# Patient Record
Sex: Female | Born: 1990 | Race: Black or African American | Hispanic: No | Marital: Single | Smoking: Never smoker
Health system: Southern US, Community
[De-identification: ages and names within clinical notes are randomized; demographics above are authoritative.]

---
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
## 2011-01-08 ENCOUNTER — Other Ambulatory Visit (HOSPITAL_COMMUNITY): Payer: Self-pay | Admitting: Pediatrics

## 2011-01-08 DIAGNOSIS — IMO0002 Reserved for concepts with insufficient information to code with codable children: Secondary | ICD-10-CM

## 2011-01-16 ENCOUNTER — Ambulatory Visit (HOSPITAL_COMMUNITY)
Admission: RE | Admit: 2011-01-16 | Discharge: 2011-01-16 | Disposition: A | Discharge status: Home / Self Care | Payer: Medicaid Other | Source: Ambulatory Visit | Attending: Pediatrics | Admitting: Pediatrics

## 2011-01-16 DIAGNOSIS — IMO0002 Reserved for concepts with insufficient information to code with codable children: Secondary | ICD-10-CM

## 2011-01-16 DIAGNOSIS — R51 Headache: Secondary | ICD-10-CM | POA: Insufficient documentation

## 2011-01-16 DIAGNOSIS — R209 Unspecified disturbances of skin sensation: Secondary | ICD-10-CM | POA: Insufficient documentation

## 2011-01-16 DIAGNOSIS — M6281 Muscle weakness (generalized): Secondary | ICD-10-CM | POA: Insufficient documentation

## 2014-10-14 ENCOUNTER — Ambulatory Visit (INDEPENDENT_AMBULATORY_CARE_PROVIDER_SITE_OTHER): Payer: 59 | Admitting: Internal Medicine

## 2014-10-14 ENCOUNTER — Encounter: Payer: Self-pay | Admitting: Internal Medicine

## 2014-10-14 ENCOUNTER — Other Ambulatory Visit (INDEPENDENT_AMBULATORY_CARE_PROVIDER_SITE_OTHER): Payer: 59

## 2014-10-14 VITALS — BP 114/62 | HR 72 | Temp 98.6°F | Resp 16 | Ht 64.0 in | Wt 123.8 lb

## 2014-10-14 DIAGNOSIS — B360 Pityriasis versicolor: Secondary | ICD-10-CM | POA: Insufficient documentation

## 2014-10-14 DIAGNOSIS — Z0000 Encounter for general adult medical examination without abnormal findings: Secondary | ICD-10-CM

## 2014-10-14 LAB — COMPREHENSIVE METABOLIC PANEL
ALK PHOS: 49 U/L (ref 39–117)
ALT: 11 U/L (ref 0–35)
AST: 13 U/L (ref 0–37)
Albumin: 4.4 g/dL (ref 3.5–5.2)
BILIRUBIN TOTAL: 0.7 mg/dL (ref 0.2–1.2)
BUN: 17 mg/dL (ref 6–23)
CO2: 28 mEq/L (ref 19–32)
CREATININE: 0.69 mg/dL (ref 0.40–1.20)
Calcium: 9.4 mg/dL (ref 8.4–10.5)
Chloride: 105 mEq/L (ref 96–112)
GFR: 135.45 mL/min (ref >60.00)
Glucose, Bld: 86 mg/dL (ref 70–99)
Potassium: 3.8 mEq/L (ref 3.5–5.1)
SODIUM: 138 meq/L (ref 135–145)
TOTAL PROTEIN: 7.5 g/dL (ref 6.0–8.3)

## 2014-10-14 LAB — LIPID PANEL
Cholesterol: 156 mg/dL (ref 0–200)
HDL: 61.4 mg/dL (ref >39.00)
LDL CALC: 78 mg/dL (ref 0–99)
NONHDL: 94.6
Total CHOL/HDL Ratio: 3
Triglycerides: 85 mg/dL (ref 0.0–149.0)
VLDL: 17 mg/dL (ref 0.0–40.0)

## 2014-10-14 LAB — CBC
HCT: 35.3 % — ABNORMAL LOW (ref 36.0–46.0)
Hemoglobin: 11.8 g/dL — ABNORMAL LOW (ref 12.0–15.0)
MCHC: 33.5 g/dL (ref 30.0–36.0)
MCV: 80.5 fl (ref 78.0–100.0)
PLATELETS: 199 10*3/uL (ref 150.0–400.0)
RBC: 4.39 Mil/uL (ref 3.87–5.11)
RDW: 17 % — ABNORMAL HIGH (ref 11.5–15.5)
WBC: 5 10*3/uL (ref 4.0–10.5)

## 2014-10-14 MED ORDER — FLUCONAZOLE 150 MG PO TABS: 300.0000 mg | ORAL_TABLET | ORAL | Status: DC

## 2014-10-14 NOTE — Patient Instructions (Addendum)
We will do your blood work today.   The name of some good groups in town for OB/Gyn are [facility] and [facility] [employer] [facility]. They both have websites and you can look at the doctors they have.   If you are doing well you can come back in 1-2 years for a physical. If you have any problems or questions please feel free to call us sooner.   Health Maintenance - 84-23 Years Old SCHOOL PERFORMANCE After high school, you may attend college or technical or vocational school, enroll in the [employer], or enter the workforce. PHYSICAL, SOCIAL, AND EMOTIONAL DEVELOPMENT  One hour of regular physical activity daily is recommended. Continue to participate in sports.  Develop your own interests and consider community service or volunteerism.  Make decisions about college and work plans.  Throughout these years, you should assume responsibility for your own health care. Increasing independence is important for you.  You may be exploring your sexual identity. Understand that you should never be in a situation that makes you feel uncomfortable, and tell your partner if you do not want to engage in sexual activity.  Body image may become important to you. Be mindful that eating disorders can develop at this time. Talk to your parents or other caregivers if you have concerns about body image, weight gain, or losing weight.  You may notice mood disturbances, depression, anxiety, attention problems, or trouble with alcohol. Talk to your health care provider if you have concerns about mental illness.  Set limits for yourself and talk with your parents or other caregivers about independent decision making.  Handle conflict without physical violence.  Avoid loud noises which may impair hearing.  Limit television and computer time to 2 hours each day. Individuals who engage in excessive inactivity are more likely to become overweight. RECOMMENDED IMMUNIZATIONS  Influenza vaccine.  All adults should be  immunized every year.  All adults, including pregnant women and people with hives-only allergy to eggs, can receive the inactivated influenza (IIV) vaccine.  Adults aged 18-49 years can receive the recombinant influenza (RIV) vaccine. The RIV vaccine does not contain any egg protein.  Tetanus, diphtheria, and acellular pertussis (Td, Tdap) vaccine.  Pregnant women should receive 1 dose of Tdap vaccine during each pregnancy. The dose should be obtained regardless of the length of time since the last dose. Immunization is preferred during the 27th to 36th week of gestation.  An adult who has not previously received Tdap or who does not know his or her vaccine status should receive 1 dose of Tdap. This initial dose should be followed by tetanus and diphtheria toxoids (Td) booster doses every 10 years.  Adults with an unknown or incomplete history of completing a 3-dose immunization series with Td-containing vaccines should begin or complete a primary immunization series including a Tdap dose.  Adults should receive a Td booster every 10 years.  Varicella vaccine.  An adult without evidence of immunity to varicella should receive 2 doses or a second dose if he or she has previously received 1 dose.  Pregnant females who do not have evidence of immunity should receive the first dose after pregnancy. This first dose should be obtained before leaving the health care facility. The second dose should be obtained 4-8 weeks after the first dose.  Human papillomavirus (HPV) vaccine.  Females aged 13-26 years who have not received the vaccine previously should obtain the 3-dose series.  The vaccine is not recommended for pregnant females. However, pregnancy testing  is not needed before receiving a dose. If a female is found to be pregnant after receiving a dose, no treatment is needed. In that case, the remaining doses should be delayed until after the pregnancy.  Males aged 29-21 years who have not  received the vaccine previously should receive the 3-dose series. Males aged 22-26 years may be immunized.  Immunization is recommended through the age of 68 years for any female who has sex with males and did not get any or all doses earlier.  Immunization is recommended for any person with an immunocompromised condition through the age of 68 years if he or she did not get any or all doses earlier.  During the 3-dose series, the second dose should be obtained 4-8 weeks after the first dose. The third dose should be obtained 24 weeks after the first dose and 16 weeks after the second dose.  Measles, mumps, and rubella (MMR) vaccine.  Adults born in [birth year] or later should have 1 or more doses of MMR vaccine unless there is a contraindication to the vaccine or there is laboratory evidence of immunity to each of the three diseases.  A routine second dose of MMR vaccine should be obtained at least 28 days after the first dose for students attending postsecondary schools, health care workers, and international travelers.  For females of childbearing age, rubella immunity should be determined. If there is no evidence of immunity, females who are not pregnant should be vaccinated. If there is no evidence of immunity, females who are pregnant should delay immunization until after pregnancy.  Pneumococcal 13-valent conjugate (PCV13) vaccine.  When indicated, a person who is uncertain of his or her immunization history and has no record of immunization should receive the PCV13 vaccine.  An adult aged 21 years or older who has certain medical conditions and has not been previously immunized should receive 1 dose of PCV13 vaccine. This PCV13 should be followed with a dose of pneumococcal polysaccharide (PPSV23) vaccine. The PPSV23 vaccine dose should be obtained at least 8 weeks after the dose of PCV13 vaccine.  An adult aged 12 years or older who has certain medical conditions and previously received 1 or  more doses of PPSV23 vaccine should receive 1 dose of PCV13. The PCV13 vaccine dose should be obtained 1 or more years after the last PPSV23 vaccine dose.  Pneumococcal polysaccharide (PPSV23) vaccine.  When PCV13 is also indicated, PCV13 should be obtained first.  An adult younger than age 65 years who has certain medical conditions should be immunized.  Any person who resides in a long-term care facility should be immunized.  An adult smoker should be immunized.  People with an immunocompromised condition and certain other conditions should receive both PCV13 and PPSV23 vaccines.  People with human immunodeficiency virus (HIV) infection should be immunized as soon as possible after diagnosis.  Immunization during chemotherapy or radiation therapy should be avoided.  Routine use of PPSV23 vaccine is not recommended for American Indians, [place] Natives, or people younger than 65 years unless there are medical conditions that require PPSV23 vaccine.  When indicated, people who have unknown immunization and have no record of immunization should receive PPSV23 vaccine.  One-time revaccination 5 years after the first dose of PPSV23 is recommended for people aged 19-64 years who have chronic kidney failure, nephrotic syndrome, asplenia, or immunocompromised conditions.  Meningococcal vaccine.  Adults with asplenia or persistent complement component deficiencies should receive 2 doses of quadrivalent meningococcal conjugate (MenACWY-D) vaccine. The doses  should be obtained at least 2 months apart.  Microbiologists working with certain meningococcal bacteria, [place] recruits, people at risk during an outbreak, and people who travel to or live in countries with a high rate of meningitis should be immunized.  A first-year college student up through age 15 years who is living in a residence hall should receive a dose if he or she did not receive a dose on or after his or her 16th  birthday.  Adults who have certain high-risk conditions should receive one or more doses of vaccine.  Hepatitis A vaccine.  Adults who wish to be protected from this disease, have certain high-risk conditions, work with hepatitis A-infected animals, work in hepatitis A research labs, or travel to or work in countries with a high rate of hepatitis A should be immunized.  Adults who were previously unvaccinated and who anticipate close contact with an international adoptee during the first 60 days after arrival in the [place] from a country with a high rate of hepatitis A should be immunized.  Hepatitis B vaccine.  Adults who wish to be protected from this disease, have certain high-risk conditions, may be exposed to blood or other infectious body fluids, are household contacts or sex partners of hepatitis B positive people, are clients or workers in certain care facilities, or travel to or work in countries with a high rate of hepatitis B should be immunized.  Haemophilus influenzae type b (Hib) vaccine.  A previously unvaccinated person with asplenia or sickle cell disease or having a scheduled splenectomy should receive 1 dose of Hib vaccine.  Regardless of previous immunization, a recipient of a hematopoietic stem cell transplant should receive a 3-dose series 6-12 months after his or her successful transplant.  Hib vaccine is not recommended for adults with HIV infection. TESTING  Annual screening for vision and hearing problems is recommended. Vision should be screened at least once between 77-65 years of age.  You may be screened for anemia or tuberculosis.  You should have a blood test to check for high cholesterol.  You should be screened for alcohol and drug use.  If you are sexually active, you may be screened for sexually transmitted infections (STIs), pregnancy, or HIV. You should be screened for STIs if:  Your sexual activity has changed since the last screening  test, and you are at an increased risk for chlamydia or gonorrhea. Ask your health care provider if you are at risk.  If you are at an increased risk for hepatitis B, you should be screened for this virus. You are considered at high risk for hepatitis B if you:  Were born in a country where hepatitis B occurs often. Talk with your health care provider about which countries are considered high risk.  Have parents who were born in a high-risk country and have not received a shot to protect against hepatitis B (hepatitis B vaccine).  Have HIV or AIDS.  Use needles to inject street drugs.  Live with or have sex with someone who has hepatitis B.  Are a man who has sex with other men (MSM).  Get hemodialysis treatment.  Take certain medicines for conditions like cancer, organ transplantation, or autoimmune conditions. NUTRITION   You should:  Have three servings of low-fat milk and dairy products daily. If you do not drink milk or consume dairy products, you should eat calcium-enriched foods, such as juice, bread, or cereal. Dark, leafy greens or canned fish are alternate sources of calcium.  Drink plenty of water. Fruit juice should be limited to 8-12 oz (240-360 mL) each day. Sugary beverages and sodas should be avoided.  Avoid eating foods high in fat, salt, or sugar, such as chips, candy, and cookies.  Avoid fast foods and limit eating out at restaurants.  Try not to skip meals, especially breakfast. You should eat a variety of vegetables, fruits, and lean meats.  Eat meals together as a family whenever possible. ORAL HEALTH Brush your teeth twice a day and floss at least once a day. You should have two dental exams a year.  SKIN CARE You should wear sunscreen when out in the sun. TALK TO SOMEONE ABOUT:  Precautions against pregnancy, contraception, and sexually transmitted infections.  Taking a prescription medicine daily to prevent HIV infection if you are at risk of being  infected with HIV. This is called preexposure prophylaxis (PrEP). You are at risk if you:  Are a female who has sex with other males (MSM).  Are heterosexual and sexually active with more than one partner.  Take drugs by injection.  Are sexually active with a partner who has HIV.  Whether you are at high risk of being infected with HIV. If you choose to begin PrEP, you should first be tested for HIV. You should then be tested every 3 months for as long as you are taking PrEP.  Drug, tobacco, and alcohol use among your friends or at friends' homes. Smoking tobacco or marijuana and taking drugs have health consequences and may impact your brain development.  Appropriate use of over-the-counter or prescription medicines.  Driving guidelines and riding with friends.  The risks of drinking and driving or boating. Call someone if you have been drinking or using drugs and need a ride. WHAT'S NEXT? Visit your pediatrician or family physician once a year. By young adulthood, you should transition from your pediatrician to a family physician or internal medicine specialist. If you are a female and are sexually active, you may want to begin annual physical exams with a gynecologist. Document Released: 11/01/2006 Document Revised: 08/11/2013 Document Reviewed: 11/21/2006 [facility] Patient Information 2015 [place], [place]. This information is not intended to replace advice given to you by your health care provider. Make sure you discuss any questions you have with your health care provider.

## 2014-10-14 NOTE — Progress Notes (Signed)
Pre visit review using our clinic review tool, if applicable. No additional management support is needed unless otherwise documented below in the visit note. 

## 2014-10-14 NOTE — Assessment & Plan Note (Signed)
Up to date on tetanus shot, declines flu shot. Never had pap smear and she will go to gynecology. Non-smoker, exercises minimally. Talked with her about safe sex, moderation and decreased drinking (she does not drink), seat belts, no drugs (she does not).

## 2014-10-14 NOTE — Assessment & Plan Note (Signed)
Fluconazole 300 mg weekly for 3 weeks.

## 2014-10-14 NOTE — Progress Notes (Signed)
   Subjective:    Patient ID: Anna Soto, female    DOB: 1991/01/28, 24 y.o.   MRN: [id number]  HPI The patient is a 24 YO healthy female who is here for wellness. She has tinea versicolor which was partially treated with cream but did not help much. Denies other medical problems.   [facility], social history, allergies, medication, problem list reviewed and updated.   Review of Systems  Constitutional: Negative for fever, chills, activity change, appetite change, fatigue and unexpected weight change.  HENT: Negative.   Eyes: Negative.   Respiratory: Negative for cough, chest tightness, shortness of breath and wheezing.   Cardiovascular: Negative for chest pain, palpitations and leg swelling.  Gastrointestinal: Negative for abdominal pain, diarrhea, constipation and abdominal distention.  Endocrine: Negative.   Musculoskeletal: Negative.   Skin: Negative.   Neurological: Negative.   Psychiatric/Behavioral: Negative.       Objective:   Physical Exam  Constitutional: She is oriented to person, place, and time. She appears well-developed and well-nourished.  HENT:  Head: Normocephalic and atraumatic.  Eyes: EOM are normal.  Neck: Normal range of motion.  Cardiovascular: Normal rate and regular rhythm.   No murmur heard. Pulmonary/Chest: Effort normal and breath sounds normal. No respiratory distress. She has no wheezes. She has no rales.  Abdominal: Soft. Bowel sounds are normal.  Musculoskeletal: She exhibits no edema.  Neurological: She is alert and oriented to person, place, and time.  Skin: Skin is warm and dry.  Psychiatric: She has a normal mood and affect.   Filed Vitals:   10/14/14 1515  BP: 114/62  Pulse: 72  Temp: 98.6 F (37 C)  TempSrc: Oral  Resp: 16  Height: 5\' 4"  (1.626 m)  Weight: 123 lb 12.8 oz (56.155 kg)  SpO2: 99%      Assessment & Plan:

## 2014-10-19 NOTE — Progress Notes (Signed)
Erroneous encounter due to message timedout.

## 2015-10-24 ENCOUNTER — Ambulatory Visit (INDEPENDENT_AMBULATORY_CARE_PROVIDER_SITE_OTHER): Payer: BLUE CROSS/BLUE SHIELD | Admitting: Internal Medicine

## 2015-10-24 ENCOUNTER — Encounter: Payer: Self-pay | Admitting: Internal Medicine

## 2015-10-24 VITALS — BP 110/64 | HR 76 | Temp 98.4°F | Resp 16 | Ht 64.0 in | Wt 123.4 lb

## 2015-10-24 DIAGNOSIS — G4452 New daily persistent headache (NDPH): Secondary | ICD-10-CM | POA: Diagnosis not present

## 2015-10-24 MED ORDER — ATENOLOL 50 MG PO TABS
50.0000 mg | ORAL_TABLET | Freq: Every day | ORAL | Status: DC
Start: 2015-10-24 — End: 2016-03-31

## 2015-10-24 MED ORDER — SUMATRIPTAN SUCCINATE 25 MG PO TABS: 25.0000 mg | ORAL_TABLET | ORAL | Status: DC | PRN

## 2015-10-24 NOTE — Progress Notes (Signed)
Pre visit review using our clinic review tool, if applicable. No additional management support is needed unless otherwise documented below in the visit note. 

## 2015-10-24 NOTE — Patient Instructions (Addendum)
We have sent in a medicine called atenolol that should help to decrease headaches. It may take 1-2 weeks to help fully.   We have also sent in a medicine to abort headaches called sumatriptan that you can take for headaches.   We would like to use try to use ibuprofen sparingly if you can to help decrease the risk of getting rebound headaches from the medicine itself.   The amount of sleep you are getting could be causing these headaches.  Migraine Headache A migraine headache is an intense, throbbing pain on one or both sides of your head. A migraine can last for 30 minutes to several hours. CAUSES  The exact cause of a migraine headache is not always known. However, a migraine may be caused when nerves in the brain become irritated and release chemicals that cause inflammation. This causes pain. Certain things may also trigger migraines, such as:  Alcohol.  Smoking.  Stress.  Menstruation.  Aged cheeses.  Foods or drinks that contain nitrates, glutamate, aspartame, or tyramine.  Lack of sleep.  Chocolate.  Caffeine.  Hunger.  Physical exertion.  Fatigue.  Medicines used to treat chest pain (nitroglycerine), birth control pills, estrogen, and some blood pressure medicines. SIGNS AND SYMPTOMS  Pain on one or both sides of your head.  Pulsating or throbbing pain.  Severe pain that prevents daily activities.  Pain that is aggravated by any physical activity.  Nausea, vomiting, or both.  Dizziness.  Pain with exposure to bright lights, loud noises, or activity.  General sensitivity to bright lights, loud noises, or smells. Before you get a migraine, you may get warning signs that a migraine is coming (aura). An aura may include:  Seeing flashing lights.  Seeing bright spots, halos, or zigzag lines.  Having tunnel vision or blurred vision.  Having feelings of numbness or tingling.  Having trouble talking.  Having muscle weakness. DIAGNOSIS  A migraine  headache is often diagnosed based on:  Symptoms.  Physical exam.  A CT scan or MRI of your head. These imaging tests cannot diagnose migraines, but they can help rule out other causes of headaches. TREATMENT Medicines may be given for pain and nausea. Medicines can also be given to help prevent recurrent migraines.  HOME CARE INSTRUCTIONS  Only take over-the-counter or prescription medicines for pain or discomfort as directed by your health care provider. The use of long-term narcotics is not recommended.  Lie down in a dark, quiet room when you have a migraine.  Keep a journal to find out what may trigger your migraine headaches. For example, write down:  What you eat and drink.  How much sleep you get.  Any change to your diet or medicines.  Limit alcohol consumption.  Quit smoking if you smoke.  Get 7-9 hours of sleep, or as recommended by your health care provider.  Limit stress.  Keep lights dim if bright lights bother you and make your migraines worse. SEEK IMMEDIATE MEDICAL CARE IF:   Your migraine becomes severe.  You have a fever.  You have a stiff neck.  You have vision loss.  You have muscular weakness or loss of muscle control.  You start losing your balance or have trouble walking.  You feel faint or pass out.  You have severe symptoms that are different from your first symptoms. MAKE SURE YOU:   Understand these instructions.  Will watch your condition.  Will get help right away if you are not doing well or get  worse.   This information is not intended to replace advice given to you by your health care provider. Make sure you discuss any questions you have with your health care provider.   Document Released: 08/06/2005 Document Revised: 08/27/2014 Document Reviewed: 04/13/2013 Elsevier Interactive Patient Education 2016 [employer]   Analgesic Rebound Headaches An analgesic rebound headache is a headache that returns after pain medicine  (analgesic) that was taken to treat the initial headache wears off. People who suffer from tension, migraine, or cluster headaches are at risk for developing rebound headaches. Any type of primary headache can return as a rebound headache if you regularly take analgesics more than three times a week. If the cycle of rebound headaches continues, they become chronic daily headaches.  CAUSES Analgesics frequently associated with this problem include common over-the-counter medicines like aspirin, ibuprofen, acetaminophen, sinus relief medicines, and other medicines that contain caffeine. Narcotic pain medicines are also a common cause of rebound headaches.  SIGNS AND SYMPTOMS The symptoms of rebound headaches are the same as the symptoms of your initial headache. Symptoms of specific types of headaches include: Tension headache  Pressure around the head.  Dull, aching head pain.  Pain felt over the front and sides of the head.  Tenderness in the muscles of the head, neck and shoulders. Migraine Headache  Pulsing or throbbing pain on one or both sides of the head.  Severe pain that interferes with daily activities.  Pain that is worsened by physical activity.  Nausea, vomiting, or both.  Pain with exposure to bright light, loud noises, or strong smells.  General sensitivity to bright light, loud noises, or strong smells.  Visual changes.  Numbness of one or both arms. Cluster Headaches  Severe pain that begins in or around one eye or temple.  Redness in the eye on the same side as the pain.  Droopy or swollen eyelid.  One-sided head pain.  Nausea.  Runny nose.  Sweaty, pale facial skin.  Restlessness. DIAGNOSIS  Analgesic rebound headaches are diagnosed by reviewing your medical history. This includes the nature of your initial headaches, as well as the type of pain medicines you have been using to treat your headaches and how often you take  them. TREATMENT Discontinuing frequent use of the analgesic medicine will typically reduce the frequency of the rebound episodes. This may initially worsen your headaches but eventually the pain should become more manageable, less frequent, and less severe.  Seeing a headache specialists may helpful. He or she may be able to help you manage your headaches and to make sure there is not another cause of the headaches. Alternative methods of stress relief such as acupuncture, counseling, biofeedback, and massage may also be helpful. Talk with your health care provider about which alternative treatments might be good for you. HOME CARE INSTRUCTIONS Stopping the regular use of pain medicine can be difficult. Follow your health care provider's instructions carefully. Keep all of your appointments. Avoid triggers that are known to cause your primary headaches. SEEK MEDICAL CARE IF: You continue to experience headaches after following your health care provider's recommended treatments. SEEK IMMEDIATE MEDICAL CARE IF:  You develop new headache pain.  You develop headache pain that is different than what you have experienced in the past.  You develop numbness or tingling in your arms or legs.  You develop changes in your speech or vision. MAKE SURE YOU:  Understand these instructions.  Will watch your child's condition.  Will get help right away  if your child is not doing well or gets worse.   This information is not intended to replace advice given to you by your health care provider. Make sure you discuss any questions you have with your health care provider.   Document Released: 10/27/2003 Document Revised: 08/27/2014 Document Reviewed: 02/19/2013 Elsevier Interactive Patient Education [employer]

## 2015-10-25 DIAGNOSIS — R51 Headache: Secondary | ICD-10-CM

## 2015-10-25 DIAGNOSIS — R519 Headache, unspecified: Secondary | ICD-10-CM | POA: Insufficient documentation

## 2015-10-25 NOTE — Assessment & Plan Note (Signed)
Suspect she has some migraines but also component of analgesic rebound headaches. Advised her to avoid ibuprofen except when needed. Rx for atenolol daily for the new daily headaches to help break the cycle. Also rx for sumatriptan for severe episodes. No repeat MRI today but will watch pattern and if no improvement can consider.

## 2015-10-25 NOTE — Progress Notes (Signed)
   Subjective:    Patient ID: Anna Soto, female    DOB: 02-Nov-1990, 25 y.o.   MRN: [id number]  HPI The patient is a 25 YO female coming in for headaches. She is having morning headaches every day for the last month. Takes ibuprofen 1-2 times daily to help with headache. Also getting some pains that are sharp in her eyes which happen throughout the day. They last a few seconds but getting more common in the last several weeks. Denies change to vision or aura with the eye pains. Has had MRI of her brain in the past (4-5 years ago) for some numbness, weakness, headaches.   Review of Systems  Constitutional: Negative for fever, chills, activity change, appetite change, fatigue and unexpected weight change.  Eyes: Positive for visual disturbance.  Respiratory: Negative for cough, chest tightness, shortness of breath and wheezing.   Cardiovascular: Negative for chest pain, palpitations and leg swelling.  Gastrointestinal: Negative for abdominal pain, diarrhea, constipation and abdominal distention.  Endocrine: Negative.   Musculoskeletal: Negative.   Skin: Negative.   Neurological: Positive for headaches. Negative for dizziness, weakness and numbness.  Psychiatric/Behavioral: Negative.       Objective:   Physical Exam  Constitutional: She is oriented to person, place, and time. She appears well-developed and well-nourished.  HENT:  Head: Normocephalic and atraumatic.  Eyes: EOM are normal.  Neck: Normal range of motion.  Cardiovascular: Normal rate and regular rhythm.   Pulmonary/Chest: Effort normal and breath sounds normal. No respiratory distress. She has no wheezes. She has no rales.  Abdominal: Soft. She exhibits no distension. There is no tenderness.  Musculoskeletal: She exhibits no edema.  Neurological: She is alert and oriented to person, place, and time. Coordination normal.  Skin: Skin is warm and dry.  Psychiatric: She has a normal mood and affect.   Filed Vitals:   10/24/15 1029  BP: 110/64  Pulse: 76  Temp: 98.4 F (36.9 C)  TempSrc: Oral  Resp: 16  Height: 5\' 4"  (1.626 m)  Weight: 123 lb 6.4 oz (55.974 kg)  SpO2: 99%      Assessment & Plan:

## 2016-02-22 ENCOUNTER — Ambulatory Visit (INDEPENDENT_AMBULATORY_CARE_PROVIDER_SITE_OTHER): Payer: BLUE CROSS/BLUE SHIELD | Admitting: Internal Medicine

## 2016-02-22 VITALS — BP 102/80 | HR 81 | Temp 99.2°F | Resp 16 | Wt 122.0 lb

## 2016-02-22 DIAGNOSIS — B360 Pityriasis versicolor: Secondary | ICD-10-CM

## 2016-02-22 MED ORDER — FLUCONAZOLE 150 MG PO TABS: 300.0000 mg | ORAL_TABLET | ORAL | Status: DC

## 2016-02-22 NOTE — Progress Notes (Signed)
   Subjective:    Patient ID: Anna OchsAlicia N Soto, female    DOB: 1991-05-18, 25 y.o.   MRN: [id number]  HPI The patient is coming in for recurrent tinea versicolor. She typically gets an outbreak during the summer months. It is on her shoulder. It is itching some. No pain. Previously treated. No exposure to plants or poison ivy. No new soaps or shampoos or clothing. Her daily headaches from before are all gone and no other new complaints.   Review of Systems  Constitutional: Negative for fever, chills, activity change, appetite change, fatigue and unexpected weight change.  Respiratory: Negative for cough, chest tightness, shortness of breath and wheezing.   Cardiovascular: Negative for chest pain, palpitations and leg swelling.  Gastrointestinal: Negative for abdominal pain, diarrhea, constipation and abdominal distention.  Musculoskeletal: Negative.   Skin: Positive for rash.  Neurological: Negative.       Objective:   Physical Exam  Constitutional: She is oriented to person, place, and time. She appears well-developed and well-nourished.  HENT:  Head: Normocephalic and atraumatic.  Eyes: EOM are normal.  Neck: Normal range of motion.  Cardiovascular: Normal rate and regular rhythm.   Pulmonary/Chest: Effort normal and breath sounds normal. No respiratory distress. She has no wheezes. She has no rales.  Abdominal: Soft. She exhibits no distension. There is no tenderness.  Musculoskeletal: She exhibits no edema.  Neurological: She is alert and oriented to person, place, and time. Coordination normal.  Skin: Skin is warm and dry.  Rash on the shoulder.   Psychiatric: She has a normal mood and affect.   Filed Vitals:   02/22/16 1100  BP: 102/80  Pulse: 81  Temp: 99.2 F (37.3 C)  TempSrc: Oral  Resp: 16  Weight: 122 lb (55.339 kg)  SpO2: 99%      Assessment & Plan:

## 2016-02-22 NOTE — Progress Notes (Signed)
Pre visit review using our clinic review tool, if applicable. No additional management support is needed unless otherwise documented below in the visit note. 

## 2016-02-22 NOTE — Patient Instructions (Signed)
We have sent in the diflucan that you will take. Take 2 pills today, then 2 pills next Wednesday, then 2 pills again the following Wednesday.

## 2016-02-23 ENCOUNTER — Encounter: Payer: Self-pay | Admitting: Internal Medicine

## 2016-02-23 NOTE — Assessment & Plan Note (Signed)
Rx for diflucan. Call back if not improved.

## 2016-03-31 ENCOUNTER — Ambulatory Visit (INDEPENDENT_AMBULATORY_CARE_PROVIDER_SITE_OTHER): Payer: BLUE CROSS/BLUE SHIELD | Admitting: Internal Medicine

## 2016-03-31 ENCOUNTER — Encounter: Payer: Self-pay | Admitting: Internal Medicine

## 2016-03-31 DIAGNOSIS — J029 Acute pharyngitis, unspecified: Secondary | ICD-10-CM

## 2016-03-31 NOTE — Assessment & Plan Note (Signed)
Not clearly infectious Not an allergy sufferer but nasal findings and PND ??atypical infection Will try OTC antihistamines

## 2016-03-31 NOTE — Patient Instructions (Signed)
Please try over the counter fexofenadine  or cetirizine  daily.

## 2016-03-31 NOTE — Progress Notes (Signed)
   Subjective:    Patient ID: Anna Soto, female    DOB: 05/23/1991, 25 y.o.   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   No current facility-administered medications on file prior to visit.     No Known Allergies  No past medical history on file.  Past Surgical History:  Procedure Laterality Date  . SMALL INTESTINE SURGERY      Family History  Problem Relation Age of Onset  . Hypertension Mother   . Hypertension Maternal Aunt   . Hypertension Maternal Uncle   . Hypertension Paternal Aunt   . Hypertension Paternal Uncle   . Diabetes Maternal Grandmother   . Diabetes Maternal Grandfather   . Diabetes Paternal Grandmother   . Diabetes Paternal Grandfather     Social History   Social History  . Marital status: Single    Spouse name: N/A  . Number of children: N/A  . Years of education: N/A   Occupational History  . Not on file.   Social History Main Topics  . Smoking status: Never Smoker  . Smokeless tobacco: Not on file  . Alcohol use No  . Drug use: No  . Sexual activity: Not on file   Other Topics Concern  . Not on file   Social History Narrative  . No narrative on file   Review of Systems  No new rash No vomiting Some nausea a few weeks ago Appetite is off New pillow but only just got it. Dog sleeps in bed with her--not new Cat outside No allergy history     Objective:   Physical Exam  HENT:  Mouth/Throat: No oropharyngeal exudate.  No sinus tenderness Moderate pale nasal  congestion TMs normal Pharynx normal  Neck: Normal range of motion. Neck supple. No thyromegaly present.  Pulmonary/Chest: Effort normal and breath sounds normal. No respiratory distress. She has no wheezes. She has no rales.  Lymphadenopathy:    She has no cervical adenopathy.          Assessment & Plan:

## 2016-04-05 ENCOUNTER — Encounter: Payer: BLUE CROSS/BLUE SHIELD | Admitting: Obstetrics & Gynecology

## 2016-04-24 ENCOUNTER — Encounter: Payer: Self-pay | Admitting: Certified Nurse Midwife

## 2016-04-24 ENCOUNTER — Ambulatory Visit (INDEPENDENT_AMBULATORY_CARE_PROVIDER_SITE_OTHER): Payer: BLUE CROSS/BLUE SHIELD | Admitting: Certified Nurse Midwife

## 2016-04-24 VITALS — BP 115/77 | HR 74 | Ht 63.0 in | Wt 121.0 lb

## 2016-04-24 DIAGNOSIS — Z113 Encounter for screening for infections with a predominantly sexual mode of transmission: Secondary | ICD-10-CM

## 2016-04-24 DIAGNOSIS — Z124 Encounter for screening for malignant neoplasm of cervix: Secondary | ICD-10-CM | POA: Diagnosis not present

## 2016-04-24 DIAGNOSIS — N898 Other specified noninflammatory disorders of vagina: Secondary | ICD-10-CM

## 2016-04-24 DIAGNOSIS — Z01419 Encounter for gynecological examination (general) (routine) without abnormal findings: Secondary | ICD-10-CM

## 2016-04-24 NOTE — Progress Notes (Signed)
Subjective:     Anna Soto is a 25 y.o. female here for a routine exam.  Current complaints: clear to white vaginal discharge x1 week, no odor. She is virginal and no immediate plans for sexual activity, although requests full STD screen. Reports completion of Gardasil series. Personal health questionnaire reviewed: yes. MHx: +migraines-treated with Ibuprofen  Gynecologic History Patient's last menstrual period was 04/02/2016. Contraception: abstinence Last Pap: none.  Last mammogram: n/a  Obstetric History OB History  Gravida Para Term Preterm AB Living  0 0 0 0 0 0  SAB TAB Ectopic Multiple Live Births  0 0 0 0 0       The following portions of the patient's history were reviewed and updated as appropriate: allergies, current medications, past family history, past medical history, past social history, past surgical history and problem list.  Review of Systems Pertinent items are noted in HPI.    Objective:    BP 115/77   Pulse 74   Ht 5\' 3"  (1.6 m)   Wt 121 lb (54.9 kg)   LMP 04/02/2016   BMI 21.43 kg/m   General Appearance:    Alert, cooperative, no distress, appears stated age  Head:    Normocephalic, without obvious abnormality, atraumatic  Eyes:    PERRL, conjunctiva/corneas clear, EOM's intact, fundi    benign, both eyes  Ears:       Nose:   Nares normal  Throat:   Lips, mucosa, and tongue normal; teeth and gums normal  Neck:   Supple, symmetrical, trachea midline, no adenopathy;    thyroid:  no enlargement/tenderness/nodules; no carotid   bruit or JVD  Back:     Symmetric, no curvature, ROM normal, no CVA tenderness  Lungs:     Clear to auscultation bilaterally, respirations unlabored  Chest Wall:       Heart:    Regular rate and rhythm, S1 and S2 normal, no murmur, rub   or gallop  Breast Exam:    No tenderness, masses, or nipple abnormality  Abdomen:     Soft, non-tender, bowel sounds active all four quadrants,    no masses, no organomegaly   Genitalia:    Normal female without lesion or tenderness, scant thin white discharge. Nulliparous. Anteverted. No tenderness or adnexal masses.  Rectal:      Extremities:   Extremities normal, atraumatic, no cyanosis or edema  Pulses:     Skin:   Skin color, texture, turgor normal, no rashes or lesions  Lymph nodes:   Cervical, supraclavicular, and axillary nodes normal  Neurologic:   CNII-XII intact, normal strength, sensation and reflexes    throughout       Assessment:    Healthy female exam.      Plan:    Education reviewed: self breast exams and weight bearing exercise. Contraception: abstinence. Follow up in: 1 year. Pap screen  STD screen

## 2016-04-25 LAB — HIV ANTIBODY (ROUTINE TESTING W REFLEX): HIV 1&2 Ab, 4th Generation: NONREACTIVE

## 2016-04-25 LAB — HEPATITIS B SURFACE ANTIGEN: HEP B S AG: NEGATIVE

## 2016-04-25 LAB — GC/CHLAMYDIA PROBE AMP (~~LOC~~) NOT AT ARMC
CHLAMYDIA, DNA PROBE: NEGATIVE
NEISSERIA GONORRHEA: NEGATIVE

## 2016-04-25 LAB — RPR

## 2016-04-26 LAB — WET PREP, GENITAL
TRICH WET PREP: NONE SEEN
YEAST WET PREP: NONE SEEN

## 2016-04-26 LAB — CYTOLOGY - PAP

## 2016-06-04 ENCOUNTER — Ambulatory Visit (INDEPENDENT_AMBULATORY_CARE_PROVIDER_SITE_OTHER): Payer: BLUE CROSS/BLUE SHIELD | Admitting: Family Medicine

## 2016-06-04 VITALS — BP 122/72 | HR 96 | Temp 98.6°F | Resp 17 | Ht 65.5 in | Wt 117.0 lb

## 2016-06-04 DIAGNOSIS — J029 Acute pharyngitis, unspecified: Secondary | ICD-10-CM

## 2016-06-04 DIAGNOSIS — R0982 Postnasal drip: Secondary | ICD-10-CM

## 2016-06-04 MED ORDER — FLUTICASONE PROPIONATE 50 MCG/ACT NA SUSP: 2.0000 | Freq: Every day | NASAL | 6 refills | Status: DC

## 2016-06-04 NOTE — Progress Notes (Signed)
  Chief Complaint  Patient presents with  . throat pain on left side of unknown origin    HPI Pt reports that she was evaluated in July 2017 for sore throat.  She reports that she continues to have burning pain in her throat.  Sometimes she feels nauseous because of the drainage of burning fluid. She tried claritin without improvement She tried otc chloraseptic sprays with minor relief Reports that with the sore throat it is a nuisance The most worrisome symptoms is that the drainage makes her feel like she is going to vomit Denies fevers or chills  No past medical history on file.  Current Outpatient Prescriptions  Medication Sig Dispense Refill  . ibuprofen (ADVIL,MOTRIN) 200 MG tablet Take 200 mg by mouth every 6 (six) hours as needed.    . fluticasone (FLONASE) 50 MCG/ACT nasal spray Place 2 sprays into both nostrils daily. 16 g 6   No current facility-administered medications for this visit.     Allergies: No Known Allergies  Past Surgical History:  Procedure Laterality Date  . SMALL INTESTINE SURGERY      Social History   Social History  . Marital status: Single    Spouse name: N/A  . Number of children: N/A  . Years of education: N/A   Social History Main Topics  . Smoking status: Never Smoker  . Smokeless tobacco: None  . Alcohol use No  . Drug use: No  . Sexual activity: Not Asked   Other Topics Concern  . None   Social History Narrative  . None    ROS  Objective: Vitals:   06/04/16 1108  BP: 122/72  Pulse: 96  Resp: 17  Temp: 98.6 F (37 C)  TempSrc: Oral  SpO2: 99%  Weight: 117 lb (53.1 kg)  Height: 5' 5.5" (1.664 m)    Physical Exam General: alert, oriented, in NAD Head: normocephalic, atraumatic, no sinus tenderness Eyes: EOM intact, no scleral icterus or conjunctival injection Ears: TM clear bilaterally Throat: no pharyngeal exudate or erythema Lymph: no posterior auricular, submental or cervical lymph adenopathy Heart: normal  rate, normal sinus rhythm, no murmurs Lungs: clear to auscultation bilaterally, no wheezing   Assessment and Plan Anna Soto was seen today for throat pain on left side of unknown origin.  Diagnoses and all orders for this visit:  Postnasal drip -     fluticasone (FLONASE) 50 MCG/ACT nasal spray; Place 2 sprays into both nostrils daily.  Acute pharyngitis, unspecified etiology -     fluticasone (FLONASE) 50 MCG/ACT nasal spray; Place 2 sprays into both nostrils daily.  Advised pt to try flonase for 2 weeks daily and if no improvement will sent to ENT for evaluation for tonsillar crypts   Anna Soto A Creta LevinStallings

## 2016-06-04 NOTE — Patient Instructions (Addendum)
   IF you received an x-ray today, you will receive an invoice from [facility]. Please contact [facility] at [phone] with questions or concerns regarding your invoice.   IF you received labwork today, you will receive an invoice from [facility]. Please contact [facility] at [phone] with questions or concerns regarding your invoice.   Our billing staff will not be able to assist you with questions regarding bills from these companies.  You will be contacted with the lab results as soon as they are available. The fastest way to get your results is to activate your My Chart account. Instructions are located on the last page of this paperwork. If you have not heard from us regarding the results in 2 weeks, please contact this office.    Pharyngitis Pharyngitis is redness, pain, and swelling (inflammation) of your pharynx.  CAUSES  Pharyngitis is usually caused by infection. Most of the time, these infections are from viruses (viral) and are part of a cold. However, sometimes pharyngitis is caused by bacteria (bacterial). Pharyngitis can also be caused by allergies. Viral pharyngitis may be spread from person to person by coughing, sneezing, and personal items or utensils (cups, forks, spoons, toothbrushes). Bacterial pharyngitis may be spread from person to person by more intimate contact, such as kissing.  SIGNS AND SYMPTOMS  Symptoms of pharyngitis include:   Sore throat.   Tiredness (fatigue).   Low-grade fever.   Headache.  Joint pain and muscle aches.  Skin rashes.  Swollen lymph nodes.  Plaque-like film on throat or tonsils (often seen with bacterial pharyngitis). DIAGNOSIS  Your health care provider will ask you questions about your illness and your symptoms. Your medical history, along with a physical exam, is often all that is needed to diagnose pharyngitis. Sometimes, a rapid strep test is done. Other lab tests may  also be done, depending on the suspected cause.  TREATMENT  Viral pharyngitis will usually get better in 3-4 days without the use of medicine. Bacterial pharyngitis is treated with medicines that kill germs (antibiotics).  HOME CARE INSTRUCTIONS   Drink enough water and fluids to keep your urine clear or pale yellow.   Only take over-the-counter or prescription medicines as directed by your health care provider:   If you are prescribed antibiotics, make sure you finish them even if you start to feel better.   Do not take aspirin.   Get lots of rest.   Gargle with 8 oz of salt water ( tsp of salt per 1 qt of water) as often as every 1-2 hours to soothe your throat.   Throat lozenges (if you are not at risk for choking) or sprays may be used to soothe your throat. SEEK MEDICAL CARE IF:   You have large, tender lumps in your neck.  You have a rash.  You cough up green, yellow-brown, or bloody spit. SEEK IMMEDIATE MEDICAL CARE IF:   Your neck becomes stiff.  You drool or are unable to swallow liquids.  You vomit or are unable to keep medicines or liquids down.  You have severe pain that does not go away with the use of recommended medicines.  You have trouble breathing (not caused by a stuffy nose). MAKE SURE YOU:   Understand these instructions.  Will watch your condition.  Will get help right away if you are not doing well or get worse.   This information is not intended to replace advice given to you by your health care   provider. Make sure you discuss any questions you have with your health care provider.   Document Released: 08/06/2005 Document Revised: 05/27/2013 Document Reviewed: 04/13/2013 Elsevier Interactive Patient Education 2016 [employer] Inc[employer]  

## 2016-07-03 ENCOUNTER — Ambulatory Visit (INDEPENDENT_AMBULATORY_CARE_PROVIDER_SITE_OTHER): Payer: BLUE CROSS/BLUE SHIELD

## 2016-07-03 ENCOUNTER — Ambulatory Visit (INDEPENDENT_AMBULATORY_CARE_PROVIDER_SITE_OTHER): Payer: BLUE CROSS/BLUE SHIELD | Admitting: Physician Assistant

## 2016-07-03 VITALS — BP 110/78 | HR 88 | Temp 98.5°F | Resp 16 | Ht 65.5 in | Wt 118.2 lb

## 2016-07-03 DIAGNOSIS — R1032 Left lower quadrant pain: Secondary | ICD-10-CM

## 2016-07-03 LAB — POCT URINALYSIS DIP (MANUAL ENTRY)
BILIRUBIN UA: NEGATIVE
GLUCOSE UA: NEGATIVE
Leukocytes, UA: NEGATIVE
NITRITE UA: NEGATIVE
Protein Ur, POC: NEGATIVE
Spec Grav, UA: 1.025
Urobilinogen, UA: 0.2
pH, UA: 8.5

## 2016-07-03 LAB — POCT CBC
GRANULOCYTE PERCENT: 41.4 % (ref 37–80)
HEMATOCRIT: 33 % — AB (ref 37.7–47.9)
HEMOGLOBIN: 11 g/dL — AB (ref 12.2–16.2)
LYMPH, POC: 3.4 (ref 0.6–3.4)
MCH, POC: 26.3 pg — AB (ref 27–31.2)
MCHC: 33.5 g/dL (ref 31.8–35.4)
MCV: 78.5 fL — AB (ref 80–97)
MID (cbc): 0.4 (ref 0–0.9)
MPV: 8.5 fL (ref 0–99.8)
POC GRANULOCYTE: 2.7 (ref 2–6.9)
POC LYMPH PERCENT: 52.1 %L — AB (ref 10–50)
POC MID %: 6.5 %M (ref 0–12)
Platelet Count, POC: 104 10*3/uL — AB (ref 142–424)
RBC: 4.2 M/uL (ref 4.04–5.48)
RDW, POC: 18.4 %
WBC: 6.5 10*3/uL (ref 4.6–10.2)

## 2016-07-03 LAB — POCT URINE PREGNANCY: Preg Test, Ur: NEGATIVE

## 2016-07-03 LAB — POC MICROSCOPIC URINALYSIS (UMFC): MUCUS RE: ABSENT

## 2016-07-03 NOTE — Progress Notes (Signed)
Anna Ochslicia N Riede  MRN: [id number] DOB: [birth year]  Subjective:  Anna Soto is a 25 y.o. female seen in office today for a chief complaint of LLQ sharp intermittent abdominal pain x 2 days, rates it as a 4/10. Notes it started when she woke up yesterday and has come and gone since then. Has associated intermittent nausea and decreased appetite. Denies vomiting. She has tried ibuprofen with moderate relief.   Her last bowel movement was this morning and notes it was more runny than usual. Her last normal bowel movement was Saturday. She typically has a bowel movement daily but does note she has been having to strain harder since Sunday. Notes the pain was relieved when she went to the bathroom.   She notes she has been eating only fast food the past week because it has been really hectic and has not drank a lot of water . She denies alcohol use. She has not been around anyone with sick contacts. Pt is not currently/has never been sexually active.    Review of Systems  Constitutional: Negative for chills, diaphoresis and fever.  Genitourinary: Positive for decreased urine volume and frequency (has been having to go more often over the past 2 weeks). Negative for dysuria, hematuria, pelvic pain, vaginal bleeding and vaginal discharge.   There are no active problems to display for this patient.   Current Outpatient Prescriptions on File Prior to Visit  Medication Sig Dispense Refill  . fluticasone (FLONASE) 50 MCG/ACT nasal spray Place 2 sprays into both nostrils daily. 16 g 6  . ibuprofen (ADVIL,MOTRIN) 200 MG tablet Take 200 mg by mouth every 6 (six) hours as needed.     No current facility-administered medications on file prior to visit.     No Known Allergies\    Past Surgical History:  Procedure Laterality Date  . SMALL INTESTINE SURGERY  1993    Objective:  BP 110/78 (BP Location: Right Arm, Patient Position: Sitting, Cuff Size: Small)   Pulse 88   Temp 98.5 F (36.9 C)  (Oral)   Resp 16   Ht 5' 5.5" (1.664 m)   Wt 118 lb 3.2 oz (53.6 kg)   LMP 06/23/2016   SpO2 99%   BMI 19.37 kg/m   Physical Exam  Constitutional: She is oriented to person, place, and time and well-developed, well-nourished, and in no distress.  Sitting on exam table texting    HENT:  Head: Normocephalic and atraumatic.  Eyes: Conjunctivae are normal.  Neck: Normal range of motion.  Cardiovascular: Normal rate, regular rhythm and normal heart sounds.   Pulmonary/Chest: Effort normal and breath sounds normal.  Abdominal: Soft. Normal appearance and bowel sounds are normal. There is no tenderness. There is no rigidity, no rebound, no CVA tenderness, no tenderness at McBurney's point and negative Murphy's sign.    Neurological: She is alert and oriented to person, place, and time. Gait normal.  Skin: Skin is warm and dry.  Psychiatric: Affect normal.  Vitals reviewed.   Results for orders placed or performed in visit on 07/03/16 (from the past 24 hour(s))  POCT CBC     Status: Abnormal   Collection Time: 07/03/16  1:04 PM  Result Value Ref Range   WBC 6.5 4.6 - 10.2 K/uL   Lymph, poc 3.4 0.6 - 3.4   POC LYMPH PERCENT 52.1 (A) 10 - 50 %L   MID (cbc) 0.4 0 - 0.9   POC MID % 6.5 0 - 12 %M  POC Granulocyte 2.7 2 - 6.9   Granulocyte percent 41.4 37 - 80 %G   RBC 4.20 4.04 - 5.48 M/uL   Hemoglobin 11.0 (A) 12.2 - 16.2 g/dL   HCT, POC 16.133.0 (A) 09.637.7 - 47.9 %   MCV 78.5 (A) 80 - 97 fL   MCH, POC 26.3 (A) 27 - 31.2 pg   MCHC 33.5 31.8 - 35.4 g/dL   RDW, POC 04.518.4 %   Platelet Count, POC 104 (A) 142 - 424 K/uL   MPV 8.5 0 - 99.8 fL  POCT urine pregnancy     Status: None   Collection Time: 07/03/16  1:14 PM  Result Value Ref Range   Preg Test, Ur Negative Negative  POCT urinalysis dipstick     Status: Abnormal   Collection Time: 07/03/16  1:14 PM  Result Value Ref Range   Color, UA yellow yellow   Clarity, UA clear clear   Glucose, UA negative negative   Bilirubin, UA  negative negative   Ketones, POC UA small (15) (A) negative   Spec Grav, UA 1.025    Blood, UA trace-intact (A) negative   pH, UA 8.5    Protein Ur, POC negative negative   Urobilinogen, UA 0.2    Nitrite, UA Negative Negative   Leukocytes, UA Negative Negative  POCT Microscopic Urinalysis ([facility])     Status: Abnormal   Collection Time: 07/03/16  1:15 PM  Result Value Ref Range   WBC,UR,HPF,POC None None WBC/hpf   RBC,UR,HPF,POC None None RBC/hpf   Bacteria None None, Too numerous to count   Mucus Absent Absent   Epithelial Cells, UR Per Microscopy Few (A) None, Too numerous to count cells/hpf   Dg Abd 2 Views  Result Date: 07/03/2016 CLINICAL DATA:  Left lower quadrant abdominal pain for 2 days EXAM: ABDOMEN - 2 VIEW COMPARISON:  None. FINDINGS: The bowel gas pattern is normal. There is no evidence of free air. No radio-opaque calculi or other significant radiographic abnormality is seen. IMPRESSION: Negative. Electronically Signed   By: Elige KoHetal  Patel   On: 07/03/2016 13:30   Assessment and Plan :  1. LLQ abdominal pain -Pain was not elicited on exam and labs were unremarkable for an etiology. Pt's urine does suggest dehydration. Pt has not had a great diet this week and has not had proper hydration. Declines further imaging. -Overall, pt's clinical exam is reassuring upon presentation. We discussed pursuing further imaging but patient opted for conservative management with BRAT diet and oral hydration at this point considering the primary reason she came in the office today was to reassure her mother.  -Pt instructed that if she develops any sudden onset pain that does not go away, any lower abdominal pain, vomiting, fever, chills, or sweating, seek care immediately. And to contact me if her symptoms do not resolve within 3 days as we can plan to order further imaging at this time. Pt understands and agrees to treatment plan.   Benjiman CoreBrittany Wiseman PA-C  [facility] 07/03/2016 10:44 PM

## 2016-07-03 NOTE — Patient Instructions (Addendum)
I would start eating a bland diet until pain subsides. This includes things like bananas, rice, apples, toast, chicken noodle soup. And drink at least 64 ounces of water.   If you develop any sudden onset pain that does not go away, any lower abdominal pain, vomiting, fever, chills, or sweating, seek care immediately.   Please contact me if your symptoms do not resolved within 3 days as we can plan to order further imaging at this time.    IF you received an x-ray today, you will receive an invoice from [facility]. Please contact [facility] at [phone] with questions or concerns regarding your invoice.   IF you received labwork today, you will receive an invoice from [facility]. Please contact [facility] at [phone] with questions or concerns regarding your invoice.   Our billing staff will not be able to assist you with questions regarding bills from these companies.  You will be contacted with the lab results as soon as they are available. The fastest way to get your results is to activate your My Chart account. Instructions are located on the last page of this paperwork. If you have not heard from us regarding the results in 2 weeks, please contact this office.

## 2016-09-13 ENCOUNTER — Other Ambulatory Visit: Payer: Self-pay | Admitting: Internal Medicine

## 2016-09-24 ENCOUNTER — Ambulatory Visit (INDEPENDENT_AMBULATORY_CARE_PROVIDER_SITE_OTHER): Payer: BLUE CROSS/BLUE SHIELD | Admitting: Physician Assistant

## 2016-09-24 ENCOUNTER — Encounter: Payer: Self-pay | Admitting: Physician Assistant

## 2016-09-24 VITALS — BP 126/78 | HR 92 | Temp 98.9°F | Resp 18 | Ht 65.5 in | Wt 118.0 lb

## 2016-09-24 DIAGNOSIS — G43911 Migraine, unspecified, intractable, with status migrainosus: Secondary | ICD-10-CM

## 2016-09-24 DIAGNOSIS — R0982 Postnasal drip: Secondary | ICD-10-CM

## 2016-09-24 MED ORDER — BUTALBITAL-APAP-CAFFEINE 50-300-40 MG PO CAPS: 1.0000 | ORAL_CAPSULE | ORAL | 0 refills | Status: AC

## 2016-09-24 MED ORDER — TOPIRAMATE 25 MG PO TABS: 25.0000 mg | ORAL_TABLET | Freq: Every day | ORAL | 1 refills | Status: DC

## 2016-09-24 MED ORDER — FLUTICASONE PROPIONATE 50 MCG/ACT NA SUSP: 2.0000 | Freq: Every day | NASAL | 6 refills | Status: DC

## 2016-09-24 NOTE — Progress Notes (Signed)
Anna Soto  MRN: [id number] DOB: 02/10/1991  PCP: Myrlene Broker, MD  Subjective:  Pt is a 26 year old female who presents to clinic for migraine.  Was taking ibuprofen, advised to stop by PCP and started on Sumatriptan 25 mg. She is taking one pill every two hours. Headache doesn't get better until her third dose. She will fall asleep at that time, then headache is present when she wakes up.  Located on left side. "sharp pain" 7/0 pain scale. Pain is constant.  Headache x three weeks. Aura happen about 2-3 times in the 3 weeks.   She has tried Excedrin extra strength. Helps more than sumatriptan. Migraine management by PCP. She thinks she has been on a migraine preventative medication before, she thinks it was Atenolol.   Migraines started when she was in the sixth grade. Started menses a few months prior. Normally gets migraines once or twice a month.  Went to neurologist 2012. MRI - negative.   Also c/o post nasal drip. Needs refill of Flonase.   Review of Systems  Constitutional: Negative for chills, diaphoresis, fatigue and fever.  HENT: Negative for congestion, postnasal drip, rhinorrhea, sinus pressure, sneezing and sore throat.   Respiratory: Negative for cough, chest tightness, shortness of breath and wheezing.   Cardiovascular: Negative for chest pain and palpitations.  Gastrointestinal: Negative for abdominal pain, diarrhea, nausea and vomiting.  Neurological: Positive for headaches. Negative for weakness and light-headedness.    There are no active problems to display for this patient.   Current Outpatient Prescriptions on File Prior to Visit  Medication Sig Dispense Refill  . fluticasone (FLONASE) 50 MCG/ACT nasal spray Place 2 sprays into both nostrils daily. (Patient not taking: Reported on 09/24/2016) 16 g 6  . ibuprofen (ADVIL,MOTRIN) 200 MG tablet Take 200 mg by mouth every 6 (six) hours as needed.     No current facility-administered medications on  file prior to visit.     No Known Allergies   Objective:  BP 126/78 (BP Location: Right Arm, Patient Position: Sitting, Cuff Size: Small)   Pulse 92   Temp 98.9 F (37.2 C) (Oral)   Resp 18   Ht 5' 5.5" (1.664 m)   Wt 118 lb (53.5 kg)   LMP 09/10/2016   SpO2 99%   BMI 19.34 kg/m   Physical Exam  Constitutional: She is oriented to person, place, and time and well-developed, well-nourished, and in no distress. No distress.  Eyes: Conjunctivae and EOM are normal. Pupils are equal, round, and reactive to light.  Cardiovascular: Normal rate, regular rhythm and normal heart sounds.   Neurological: She is alert and oriented to person, place, and time. She has normal sensation. GCS score is 15.  Skin: Skin is warm and dry.  Psychiatric: Mood, memory, affect and judgment normal.  Vitals reviewed.   Assessment and Plan :  1. Intractable migraine with status migrainosus, unspecified migraine type - AMB referral to headache clinic - Butalbital-APAP-Caffeine (FIORICET) 50-300-40 MG CAPS; Take 1-2 tablets by mouth every 4 (four) hours.  Dispense: 84 capsule; Refill: 0 - topiramate (TOPAMAX) 25 MG tablet; Take 1 tablet (25 mg total) by mouth at bedtime. May increase weekly by 25 mg daily, until 100 mg daily in 2 divided doses  Dispense: 90 tablet; Refill: 1 - Start 25mg  qd Topiramate. May increase dose by 25mg  week two. F/u in 2 weeks for medication check. Migraine information printed off for pt.   2. Postnasal drip - fluticasone (FLONASE)  50 MCG/ACT nasal spray; Place 2 sprays into both nostrils daily.  Dispense: 16 g; Refill: 6   Whitney Laurance Heide, PA-C  Primary Care at [facility] 09/24/2016 11:06 AM

## 2016-09-24 NOTE — Patient Instructions (Addendum)
Headache clinic Address: [address], [place], [place] [address] Phone: [phone]   Topiramate The starting topiramate dose is 25 mg/day, May increase by 25mg  next week if needed. Until you reach 100 mg twice daily. Please follow-up with me in 2 weeks for recheck.  Do not abruptly discontinue therapy; taper dosage gradually to prevent rebound effects!! If you desire to stop this medication tell your doctor. You must decrease in weekly intervals by 25 to 50 mg daily    Recurrent Migraine Headache Migraines are a type of headache, and they are usually stronger and more sudden than normal headaches (tension headaches). Migraines are characterized by an intense pulsing, throbbing pain that is usually only present on one side of the head. Sometimes, migraine headaches can cause nausea, vomiting, sensitivity to light and sound, and vision changes. Recurrent migraines keep coming back (recurring). A migraine can last from 4 hours up to 3 days. What are the causes? The exact cause of this condition is not known. However, a migraine may be caused when nerves in the brain become irritated and release chemicals that cause inflammation of blood vessels. This inflammation causes pain. Certain things may also trigger migraines, such as:  A disruption in your regular eating and sleeping schedule.  Smoking.  Stress.  Menstruation.  Certain foods and drinks, such as:  Aged cheese.  Chocolate.  Alcohol.  Caffeine.  Foods or drinks that contain nitrates, glutamate, aspartame, MSG, or tyramine.  Lack of sleep.  Hunger.  Physical exertion.  Fatigue.  High altitude.  Weather changes.  Medicines, such as:  Nitroglycerin, which is used to treat chest pain.  Birth control pills.  Estrogen.  Some blood pressure medicines. What are the signs or symptoms? Symptoms of this condition vary for each person and may include:  Pain that is usually only present on one side of the  head. In some cases, the pain may be on both sides of the head or around the head or neck.  Pulsating or throbbing pain.  Severe pain that prevents daily activities.  Pain that is aggravated by any physical activity.  Nausea, vomiting, or both.  Dizziness.  Pain with exposure to bright lights, loud noises, or activity.  General sensitivity to bright lights, loud noises, or smells. Before you get a migraine, you may get warning signs that a migraine is coming (aura). An aura may include:  Seeing flashing lights.  Seeing bright spots, halos, or zigzag lines.  Having tunnel vision or blurred vision.  Having numbness or a tingling feeling.  Having trouble talking.  Having muscle weakness.  Smelling a certain odor. How is this diagnosed? This condition is often diagnosed based on:  Your symptoms and medical history.  A physical exam. You may also have tests, including:  A CT scan or MRI of your brain. These imaging tests cannot diagnose migraines, but they can help to rule out other causes of headaches.  Blood tests. How is this treated? This condition is treated with:  Medicines. These are used for:  Lessening pain and nausea.  Preventing recurrent migraines.  Lifestyle changes, such as changes to your diet or sleeping patterns.  Behavior therapy, such as relaxation training or biofeedback. Biofeedback is a treatment that involves teaching you to relax and use your brain to lower your heart rate and control your breathing. Follow these instructions at home: Medicines  Take over-the-counter and prescription medicines only as told by your health care provider.  Do not drive or use heavy machinery  while taking prescription pain medicine. Lifestyle  Do not use any products that contain nicotine or tobacco, such as cigarettes and e-cigarettes. If you need help quitting, ask your health care provider.  Limit alcohol intake to no more than 1 drink a day for  nonpregnant women and 2 drinks a day for men. One drink equals 12 oz of beer, 5 oz of wine, or 1 oz of hard liquor.  Get 7-9 hours of sleep each night, or the amount of sleep recommended by your health care provider.  Limit your stress. Talk with your health care provider if you need help with stress management.  Maintain a healthy weight. If you need help losing weight, ask your health care provider.  Exercise regularly. Aim for 150 minutes of moderate-intensity exercise (walking, biking, yoga) or 75 minutes of vigorous exercise (running, circuit training, swimming) each week. General instructions  Keep a journal to find out what triggers your migraine headaches so you can avoid these triggers. For example, write down:  What you eat and drink.  How much sleep you get.  Any change to your diet or medicines.  Lie down in a dark, quiet room when you have a migraine.  Try placing a cool towel over your head when you have a migraine.  Keep lights dim, if bright lights bother you and make your migraines worse.  Keep all follow-up visits as told by your health care provider. This is important. Contact a health care provider if:  Your pain does not improve, even with medicine.  Your migraines continue to return, even with medicine.  You have a fever.  You have weight loss. Get help right away if:  Your migraine becomes severe and medicine does not help.  You have a stiff neck.  You have a loss of vision.  You have muscle weakness or loss of muscle control.  You start losing your balance or have trouble walking.  You feel faint or you pass out.  You develop new, severe symptoms.  You start having abrupt severe headaches that last for a second or less, like a thunderclap. Summary  Migraine headaches are usually stronger and more sudden than normal headaches (tension headaches). Migraines are characterized by an intense pulsing, throbbing pain that is usually only present  on one side of the head.  The exact cause of this condition is not known. However, a migraine may be caused when nerves in the brain become irritated and release chemicals that cause inflammation of blood vessels.  Certain things may trigger migraines, such as changes to diet or sleeping patterns, smoking, certain foods, alcohol, stress, and certain medicines.  Sometimes, migraine headaches can cause nausea, vomiting, sensitivity to light and sound, and vision changes.  Migraines are often diagnosed based on your symptoms, medical history, and a physical exam. This information is not intended to replace advice given to you by your health care provider. Make sure you discuss any questions you have with your health care provider. Document Released: 05/01/2001 Document Revised: 05/18/2016 Document Reviewed: 05/18/2016 Elsevier Interactive Patient Education  2017 [employer].

## 2016-09-26 ENCOUNTER — Ambulatory Visit: Payer: Self-pay | Admitting: Internal Medicine

## 2016-10-10 ENCOUNTER — Ambulatory Visit (INDEPENDENT_AMBULATORY_CARE_PROVIDER_SITE_OTHER): Payer: BLUE CROSS/BLUE SHIELD | Admitting: Physician Assistant

## 2016-10-10 VITALS — BP 100/72 | HR 102 | Temp 98.8°F | Resp 16 | Ht 65.5 in | Wt 113.4 lb

## 2016-10-10 DIAGNOSIS — G43909 Migraine, unspecified, not intractable, without status migrainosus: Secondary | ICD-10-CM | POA: Insufficient documentation

## 2016-10-10 DIAGNOSIS — G43819 Other migraine, intractable, without status migrainosus: Secondary | ICD-10-CM | POA: Diagnosis not present

## 2016-10-10 DIAGNOSIS — Z79899 Other long term (current) drug therapy: Secondary | ICD-10-CM

## 2016-10-10 DIAGNOSIS — R110 Nausea: Secondary | ICD-10-CM

## 2016-10-10 MED ORDER — TOPIRAMATE 25 MG PO TABS: 25.0000 mg | ORAL_TABLET | Freq: Every day | ORAL | 1 refills | Status: DC

## 2016-10-10 NOTE — Patient Instructions (Addendum)
Taper down your dose of Topiramate: Reduce by 40m/day for one week at a time until you are taking 563mtwice a day (10074may total).  Follow-up with me in 4 weeks.  Please know that this medication is harmful to a fetus. If you plan to get pregnant, please let a medical professional know.   Thank you for coming in today. I hope you feel we met your needs.  Feel free to call [facility] if you have any questions or further requests.  Please consider signing up for MyChart if you do not already have it, as this is a great way to communicate with me.  Best,  Whitney McVey, PA-C  IF you received an x-ray today, you will receive an invoice from [facility]. Please contact [facility] at [phone] questions or concerns regarding your invoice.   IF you received labwork today, you will receive an invoice from [facility] at [phone] questions or concerns regarding your invoice.   Our billing staff will not be able to assist you with questions regarding bills from these companies.  You will be contacted with the lab results as soon as they are available. The fastest way to get your results is to activate your My Chart account. Instructions are located on the last page of this paperwork. If you have not heard from us [place] the results in 2 weeks, please contact this office.

## 2016-10-10 NOTE — Progress Notes (Signed)
   Anna Soto  MRN: [id number] DOB: 01-Jan-1991  PCP: Myrlene BrokerElizabeth A Crawford, MD  Subjective:  Pt is a 26 year old female who presents to clinic for f/u migraine. She was seen by me 09/24/2016  Was previously taking Sumatriptan 25 mg for abortive therapy - one pill q 2 hrs. Headache did not improve until 3rd dose.  She gets headaches 2-3 x/week, 7/10 pain. +aura. Affects her daily life/work/relationships. H/o headaches x  Seen by neurology 2012, MRI negative.   At her last OV stared pt on Topiramate 25 mg and advised to titrate up to 100mg  bid. Reports dizziness, tingling in hands and nausea after increased dose. Reports one headache this week and 2 last week. Headaches last 30min -1 hour. Prior to that headaches would last all day.  She is currently taking the 100mg  qd.    Review of Systems  Constitutional: Negative for chills, diaphoresis, fatigue and fever.  Respiratory: Negative for cough, chest tightness, shortness of breath and wheezing.   Gastrointestinal: Positive for nausea. Negative for abdominal pain, diarrhea and vomiting.  Neurological: Positive for dizziness and headaches. Negative for weakness, light-headedness and numbness.    There are no active problems to display for this patient.   Current Outpatient Prescriptions on File Prior to Visit  Medication Sig Dispense Refill  . fluticasone (FLONASE) 50 MCG/ACT nasal spray Place 2 sprays into both nostrils daily. 16 g 6  . ibuprofen (ADVIL,MOTRIN) 200 MG tablet Take 200 mg by mouth every 6 (six) hours as needed.    . topiramate (TOPAMAX) 25 MG tablet Take 1 tablet (25 mg total) by mouth at bedtime. May increase weekly by 25 mg daily, until 100 mg daily in 2 divided doses 90 tablet 1   No current facility-administered medications on file prior to visit.     No Known Allergies   Objective:  BP 100/72   Pulse (!) 102   Temp 98.8 F (37.1 C) (Oral)   Resp 16   Ht 5' 5.5" (1.664 m)   Wt 113 lb 6.4 oz (51.4 kg)   LMP  09/10/2016   SpO2 100%   BMI 18.58 kg/m   Physical Exam  Constitutional: She is oriented to person, place, and time and well-developed, well-nourished, and in no distress. No distress.  Cardiovascular: Normal rate, regular rhythm and normal heart sounds.   Pulmonary/Chest: Effort normal. No respiratory distress.  Neurological: She is alert and oriented to person, place, and time. GCS score is 15.  Skin: Skin is warm and dry.  Psychiatric: Mood, memory, affect and judgment normal.  Vitals reviewed.   Assessment and Plan :  1. Encounter for medication management 2. Other migraine without status migrainosus, intractable 3. Nausea - topiramate (TOPAMAX) 25 MG tablet; Take 1 tablet (25 mg total) by mouth at bedtime. May increase weekly by 25 mg daily, until 100 mg daily in 2 divided doses  Dispense: 90 tablet; Refill: 1 - Pt will titrate down to 25mg  bid due to current medication side effects of nausea and numbness. RTC in 3-4 weeks for f/u.    Marco CollieWhitney Deshane Cotroneo, PA-C  Primary Care at [facility] 10/10/2016 12:04 PM

## 2016-11-01 ENCOUNTER — Ambulatory Visit (INDEPENDENT_AMBULATORY_CARE_PROVIDER_SITE_OTHER): Payer: BLUE CROSS/BLUE SHIELD | Admitting: Physician Assistant

## 2016-11-01 ENCOUNTER — Telehealth: Payer: Self-pay | Admitting: Physician Assistant

## 2016-11-01 VITALS — BP 110/68 | HR 87 | Temp 98.4°F | Ht 62.1 in | Wt 108.0 lb

## 2016-11-01 DIAGNOSIS — J029 Acute pharyngitis, unspecified: Secondary | ICD-10-CM | POA: Diagnosis not present

## 2016-11-01 DIAGNOSIS — R05 Cough: Secondary | ICD-10-CM | POA: Diagnosis not present

## 2016-11-01 DIAGNOSIS — R059 Cough, unspecified: Secondary | ICD-10-CM

## 2016-11-01 MED ORDER — BENZONATATE 100 MG PO CAPS: 100.0000 mg | ORAL_CAPSULE | Freq: Three times a day (TID) | ORAL | 0 refills | Status: DC | PRN

## 2016-11-01 MED ORDER — HYDROCOD POLST-CPM POLST ER 10-8 MG/5ML PO SUER: 5.0000 mL | Freq: Two times a day (BID) | ORAL | 0 refills | Status: DC | PRN

## 2016-11-01 MED ORDER — AZITHROMYCIN 250 MG PO TABS: ORAL_TABLET | ORAL | 0 refills | Status: DC

## 2016-11-01 NOTE — Progress Notes (Signed)
MRN: [id number] DOB: November 19, 1990  Subjective:   Anna Soto is a 26 y.o. female presenting for chief complaint of Sore Throat (x 3 weeks has been worse, better, but lingering); Cough (clear product); and Fatigue .  Reports 3 week history of sinus congestion, rhinorrhea, sore throat and dry cough (no hemoptysis) that is keeping her up throughout the night. Her cough is sometimes productive but mostly dry and hacking.  Has tried nyquil, dayquil, flonase consistently with mild relief. Denies  ear pain, wheezing, shortness of breath and chest tightness, night sweats, chills, fatigue, nausea, vomiting, abdominal pain and diarrhea. Has not had sick contact with any. Has history of seasonal allergies, no history of asthma. Patient has not had flu shot this season. No smoking.  Denies any other aggravating or relieving factors, no other questions or concerns.  Helmut Musterlicia has a current medication list which includes the following prescription(s): fluticasone, ibuprofen, and topiramate. Also has No Known Allergies.  Helmut Musterlicia  has no past medical history on file. Also  has a past surgical history that includes Small intestine surgery (1993).   Objective:   Vitals: BP 110/68 (BP Location: Right Arm, Patient Position: Sitting, Cuff Size: Normal)   Pulse 87   Temp 98.4 F (36.9 C) (Oral)   Ht 5' 2.1" (1.577 m)   Wt 108 lb (49 kg)   LMP 10/24/2016 (Exact Date)   SpO2 97%   BMI 19.69 kg/m   Physical Exam  Constitutional: She is oriented to person, place, and time. She appears well-developed and well-nourished.  HENT:  Head: Normocephalic and atraumatic.  Right Ear: Tympanic membrane, external ear and ear canal normal.  Left Ear: Tympanic membrane, external ear and ear canal normal.  Nose: Mucosal edema ( bilaterally) present. Right sinus exhibits no maxillary sinus tenderness and no frontal sinus tenderness. Left sinus exhibits no maxillary sinus tenderness and no frontal sinus tenderness.    Mouth/Throat: Uvula is midline and mucous membranes are normal. Posterior oropharyngeal erythema present. Tonsils are 1+ on the right. Tonsils are 1+ on the left. No tonsillar exudate.  Eyes: Conjunctivae are normal.  Neck: Normal range of motion.  Cardiovascular: Normal rate, regular rhythm and normal heart sounds.   Pulmonary/Chest: Effort normal and breath sounds normal.  Lymphadenopathy:       Head (right side): No submental, no submandibular, no tonsillar, no preauricular, no posterior auricular and no occipital adenopathy present.       Head (left side): No submental, no submandibular, no tonsillar, no preauricular, no posterior auricular and no occipital adenopathy present.    She has no cervical adenopathy.       Right: No supraclavicular adenopathy present.       Left: No supraclavicular adenopathy present.  Neurological: She is alert and oriented to person, place, and time.  Skin: Skin is warm and dry.  Psychiatric: She has a normal mood and affect.  Vitals reviewed.   No results found for this or any previous visit (from the past 24 hour(s)).  Assessment and Plan :  1. Sore throat 2. Cough History and PE consistent with bronchitis. Due to duration of symptoms will cover for possible underlying atypical bacterial organism as etiology. Will also provide symptomatic management. Instructed to return to clinic if symptoms worsen, do not improve in 7-10 days as she may warrant a CXR at that time, or as needed - benzonatate (TESSALON) 100 MG capsule; Take 1-2 capsules (100-200 mg total) by mouth 3 (three) times daily as needed for  cough.  Dispense: 40 capsule; Refill: 0 - azithromycin (ZITHROMAX) 250 MG tablet; Take 2 tabs PO x 1 dose, then 1 tab PO QD x 4 days  Dispense: 6 tablet; Refill: 0 - chlorpheniramine-HYDROcodone (TUSSIONEX PENNKINETIC ER) 10-8 MG/5ML SUER; Take 5 mLs by mouth every 12 (twelve) hours as needed for cough.  Dispense: 100 mL; Refill: 0  Benjiman Core, PA-C   [facility] 11/01/2016 10:20 AM

## 2016-11-01 NOTE — Telephone Encounter (Signed)
PATIENT STATES SHE SAW BRITTANY WISEMAN TODAY AND SHE DIAGNOSED HER WITH BRONCHITIS. BRITTANY TOLD HER IT WOULD BE BEST TO RETURN BACK TO WORK ON Monday, BUT [place] WANTED TO GO BACK ON Friday. HER SUPERVISOR TOLD HER THAT SHE SHOULD WAIT UNTIL Monday (11/05/16) AS WELL. SHE NEEDS TO GET A NOTE FOR WORK. PLEASE CALL HER WHEN IT CAN BE PICKED UP. BEST PHONE [phone] (CELL) MBC

## 2016-11-01 NOTE — Telephone Encounter (Signed)
I called both the mobile and home number multiple times with no answer. There was no voicemail on the mobile number so I left a voicemail on the home number stating that I have extended the work note to 11/05/16 and sent it to her mychart. She does not have to come by to pick it up she can just print it from her mychart account.

## 2016-11-01 NOTE — Patient Instructions (Addendum)
- We will treat this as bronchitis with a potential bacterial cause.  - I recommend you rest, drink plenty of fluids, eat light meals including soups.  - You may use cough syrup at night for your cough and sore throat, Tessalon pearls during the day. Be aware that cough syrup can definitely make you drowsy and sleepy so do not drive or operate any heavy machinery if it is affecting you during the day.  -Take antibiotic as prescribed. - You may also use Tylenol, ibuprofen, or throat lozenges over-the-counter for your sore throat.  - Please let me know if you are not seeing any improvement or get worse in 7-10 days as you may need a chest xray at that time.    Acute Bronchitis, Adult Acute bronchitis is when air tubes (bronchi) in the lungs suddenly get swollen. The condition can make it hard to breathe. It can also cause these symptoms:  A cough.  Coughing up clear, yellow, or green mucus.  Wheezing.  Chest congestion.  Shortness of breath.  A fever.  Body aches.  Chills.  A sore throat. Follow these instructions at home: Medicines   Take over-the-counter and prescription medicines only as told by your doctor.  If you were prescribed an antibiotic medicine, take it as told by your doctor. Do not stop taking the antibiotic even if you start to feel better. General instructions   Rest.  Drink enough fluids to keep your pee (urine) clear or pale yellow.  Avoid smoking and secondhand smoke. If you smoke and you need help quitting, ask your doctor. Quitting will help your lungs heal faster.  Use an inhaler, cool mist vaporizer, or humidifier as told by your doctor.  Keep all follow-up visits as told by your doctor. This is important. How is this prevented? To lower your risk of getting this condition again:  Wash your hands often with soap and water. If you cannot use soap and water, use hand sanitizer.  Avoid contact with people who have cold symptoms.  Try not to  touch your hands to your mouth, nose, or eyes.  Make sure to get the flu shot every year. Contact a doctor if:  Your symptoms do not get better in 2 weeks. Get help right away if:  You cough up blood.  You have chest pain.  You have very bad shortness of breath.  You become dehydrated.  You faint (pass out) or keep feeling like you are going to pass out.  You keep throwing up (vomiting).  You have a very bad headache.  Your fever or chills gets worse. This information is not intended to replace advice given to you by your health care provider. Make sure you discuss any questions you have with your health care provider. Document Released: 01/23/2008 Document Revised: 03/14/2016 Document Reviewed: 01/25/2016 Elsevier Interactive Patient Education  2017 [employer]    IF you received an x-ray today, you will receive an invoice from [facility]. Please contact [facility] at [phone] with questions or concerns regarding your invoice.   IF you received labwork today, you will receive an invoice from [facility]. Please contact [facility] at [phone] with questions or concerns regarding your invoice.   Our billing staff will not be able to assist you with questions regarding bills from these companies.  You will be contacted with the lab results as soon as they are available. The fastest way to get your results is to activate your My Chart account. Instructions are located  on the last page of this paperwork. If you have not heard from us regarding the results in 2 weeks, please contact this office.      

## 2016-12-17 ENCOUNTER — Other Ambulatory Visit: Payer: Self-pay | Admitting: Physician Assistant

## 2016-12-17 DIAGNOSIS — G43819 Other migraine, intractable, without status migrainosus: Secondary | ICD-10-CM

## 2016-12-20 NOTE — Telephone Encounter (Signed)
09/2016 last ov and refill

## 2016-12-21 ENCOUNTER — Other Ambulatory Visit: Payer: Self-pay | Admitting: Physician Assistant

## 2016-12-21 DIAGNOSIS — R51 Headache: Principal | ICD-10-CM

## 2016-12-21 DIAGNOSIS — R519 Headache, unspecified: Secondary | ICD-10-CM

## 2016-12-21 MED ORDER — TOPIRAMATE 100 MG PO TABS: 100.0000 mg | ORAL_TABLET | Freq: Every day | ORAL | 1 refills | Status: DC

## 2017-08-20 ENCOUNTER — Emergency Department (HOSPITAL_COMMUNITY)
Admission: EM | Admit: 2017-08-20 | Discharge: 2017-08-20 | Disposition: A | Discharge status: Home / Self Care | Payer: PRIVATE HEALTH INSURANCE | Attending: Emergency Medicine | Admitting: Emergency Medicine

## 2017-08-20 ENCOUNTER — Encounter (HOSPITAL_COMMUNITY): Payer: Self-pay | Admitting: Emergency Medicine

## 2017-08-20 DIAGNOSIS — Z79899 Other long term (current) drug therapy: Secondary | ICD-10-CM | POA: Insufficient documentation

## 2017-08-20 DIAGNOSIS — R21 Rash and other nonspecific skin eruption: Secondary | ICD-10-CM | POA: Diagnosis present

## 2017-08-20 DIAGNOSIS — B360 Pityriasis versicolor: Secondary | ICD-10-CM

## 2017-08-20 DIAGNOSIS — L42 Pityriasis rosea: Secondary | ICD-10-CM | POA: Diagnosis not present

## 2017-08-20 MED ORDER — FLUCONAZOLE 200 MG PO TABS: ORAL_TABLET | ORAL | 0 refills | Status: DC

## 2017-08-20 NOTE — ED Triage Notes (Addendum)
Patient c/o itching rash initially to stomach that has spread to bilateral arm, legs, and back since 12/22.  Denies allergies.

## 2017-08-20 NOTE — ED Provider Notes (Signed)
[facility]-EMERGENCY DEPT Provider Note   CSN: [id number] Arrival date & time: 08/20/17  1431     History   Chief Complaint Chief Complaint  Patient presents with  . Rash    HPI Anna Soto is a 27 y.o. female.  The history is provided by the patient. No language interpreter was used.  Rash   This is a new problem. The current episode started more than 1 week ago. The problem has been gradually worsening. The problem is associated with nothing. There has been no fever. The rash is present on the torso. The pain is moderate. The pain has been constant since onset. She has tried nothing for the symptoms. The treatment provided no relief.   Pt has a rash that she has had for 1.5 weeks.  Pt reports she also has tinea versiclor.  Pt reports she has taken diflucan for tinea in the past and it has helped.  History reviewed. No pertinent past medical history.  Patient Active Problem List   Diagnosis Date Noted  . Migraine 10/10/2016    Past Surgical History:  Procedure Laterality Date  . SMALL INTESTINE SURGERY  1993    OB History    Gravida Para Term Preterm AB Living   0 0 0 0 0 0   SAB TAB Ectopic Multiple Live Births   0 0 0 0 0       Home Medications    Prior to Admission medications   Medication Sig Start Date End Date Taking? Authorizing Provider  azithromycin (ZITHROMAX) 250 MG tablet Take 2 tabs PO x 1 dose, then 1 tab PO QD x 4 days 11/01/16   Benjiman Core D, PA-C  benzonatate (TESSALON) 100 MG capsule Take 1-2 capsules (100-200 mg total) by mouth 3 (three) times daily as needed for cough. 11/01/16   Benjiman Core D, PA-C  chlorpheniramine-HYDROcodone (TUSSIONEX PENNKINETIC ER) 10-8 MG/5ML SUER Take 5 mLs by mouth every 12 (twelve) hours as needed for cough. 11/01/16   Benjiman Core D, PA-C  fluconazole (DIFLUCAN) 200 MG tablet 2 tablets once a week 08/20/17   Elson Areas, PA-C  fluticasone [facility]) 50 MCG/ACT nasal spray  Place 2 sprays into both nostrils daily. 09/24/16   McVey, Madelaine Bhat, PA-C  ibuprofen (ADVIL,MOTRIN) 200 MG tablet Take 200 mg by mouth every 6 (six) hours as needed.    [provider]  topiramate (TOPAMAX) 100 MG tablet Take 1 tablet (100 mg total) by mouth daily. 12/21/16   McVey, Madelaine Bhat, PA-C    Family History Family History  Problem Relation Age of Onset  . Hypertension Mother   . Hypertension Maternal Aunt   . Hypertension Maternal Uncle   . Hypertension Paternal Aunt   . Hypertension Paternal Uncle   . Diabetes Maternal Grandmother   . Diabetes Maternal Grandfather   . Diabetes Paternal Grandmother   . Diabetes Paternal Grandfather     Social History Social History   Tobacco Use  . Smoking status: Never Smoker  . Smokeless tobacco: Never Used  Substance Use Topics  . Alcohol use: No    Alcohol/week: 0.0 oz  . Drug use: No     Allergies   Patient has no known allergies.   Review of Systems Review of Systems  Skin: Positive for rash.  All other systems reviewed and are negative.    Physical Exam Updated Vital Signs BP 125/89 (BP Location: Right Arm)   Pulse 89   Temp 98.9 F (  37.2 C)   Resp 16   SpO2 100%   Physical Exam  Constitutional: She appears well-developed and well-nourished.  HENT:  Head: Normocephalic.  Right Ear: External ear normal.  Left Ear: External ear normal.  Nose: Nose normal.  Mouth/Throat: Oropharynx is clear and moist.  Eyes: Conjunctivae are normal. Pupils are equal, round, and reactive to light.  Neck: Normal range of motion.  Cardiovascular: Normal rate.  Pulmonary/Chest: Effort normal.  Abdominal: Soft.  Musculoskeletal: Normal range of motion.  Skin: Skin is warm.  Psychiatric: She has a normal mood and affect.  Nursing note and vitals reviewed.    ED Treatments / Results  Labs (all labs ordered are listed, but only abnormal results are displayed) Labs Reviewed - No data to  display  EKG  EKG Interpretation None       Radiology No results found.  Procedures Procedures (including critical care time)  Medications Ordered in ED Medications - No data to display   Initial Impression / Assessment and Plan / ED Course  I have reviewed the triage vital signs and the nursing notes.  Pertinent labs & imaging results that were available during my care of the patient were reviewed by me and considered in my medical decision making (see chart for details).    I think pt has pityrasis rosea.  I counseled her on symptoms.  I reviewed pt rx from her MD.  I will treat with diflucan as previously treated. An After Visit Summary was printed and given to the patient.   Final Clinical Impressions(s) / ED Diagnoses   Final diagnoses:  Tinea versicolor  Pityriasis rosea    ED Discharge Orders        Ordered    fluconazole (DIFLUCAN) 200 MG tablet     08/20/17 1714    An After Visit Summary was printed and given to the patient.    Elson AreasSofia, Leslie K, [place] JerseyPA-C 08/20/17 1949    Arby BarrettePfeiffer, Marcy, MD 08/23/17 [phone]

## 2019-03-28 ENCOUNTER — Encounter (HOSPITAL_COMMUNITY): Payer: Self-pay

## 2019-03-28 ENCOUNTER — Emergency Department (HOSPITAL_COMMUNITY): Payer: BLUE CROSS/BLUE SHIELD

## 2019-03-28 ENCOUNTER — Inpatient Hospital Stay (HOSPITAL_COMMUNITY)
Admission: EM | Admit: 2019-03-28 | Discharge: 2019-03-29 | DRG: 392 | Disposition: A | Discharge status: Home / Self Care | Payer: BLUE CROSS/BLUE SHIELD | Attending: Internal Medicine | Admitting: Internal Medicine

## 2019-03-28 ENCOUNTER — Other Ambulatory Visit: Payer: Self-pay

## 2019-03-28 DIAGNOSIS — K56609 Unspecified intestinal obstruction, unspecified as to partial versus complete obstruction: Secondary | ICD-10-CM | POA: Diagnosis not present

## 2019-03-28 DIAGNOSIS — R609 Edema, unspecified: Secondary | ICD-10-CM | POA: Diagnosis present

## 2019-03-28 DIAGNOSIS — Z79899 Other long term (current) drug therapy: Secondary | ICD-10-CM

## 2019-03-28 DIAGNOSIS — Z20828 Contact with and (suspected) exposure to other viral communicable diseases: Secondary | ICD-10-CM | POA: Diagnosis present

## 2019-03-28 DIAGNOSIS — K529 Noninfective gastroenteritis and colitis, unspecified: Principal | ICD-10-CM | POA: Diagnosis present

## 2019-03-28 DIAGNOSIS — E876 Hypokalemia: Secondary | ICD-10-CM | POA: Diagnosis not present

## 2019-03-28 DIAGNOSIS — R1013 Epigastric pain: Secondary | ICD-10-CM

## 2019-03-28 LAB — URINALYSIS, ROUTINE W REFLEX MICROSCOPIC
Bilirubin Urine: NEGATIVE
Glucose, UA: NEGATIVE mg/dL
Hgb urine dipstick: NEGATIVE
Ketones, ur: NEGATIVE mg/dL
Leukocytes,Ua: NEGATIVE
Nitrite: NEGATIVE
Protein, ur: NEGATIVE mg/dL
Specific Gravity, Urine: 1.029 (ref 1.005–1.030)
pH: 5 (ref 5.0–8.0)

## 2019-03-28 LAB — COMPREHENSIVE METABOLIC PANEL
ALT: 10 U/L (ref 0–44)
AST: 18 U/L (ref 15–41)
Albumin: 5 g/dL (ref 3.5–5.0)
Alkaline Phosphatase: 53 U/L (ref 38–126)
Anion gap: 10 (ref 5–15)
BUN: 18 mg/dL (ref 6–20)
CO2: 23 mmol/L (ref 22–32)
Calcium: 9.3 mg/dL (ref 8.9–10.3)
Chloride: 104 mmol/L (ref 98–111)
Creatinine, Ser: 0.8 mg/dL (ref 0.44–1.00)
GFR calc Af Amer: >60 mL/min (ref >60)
GFR calc non Af Amer: >60 mL/min (ref >60)
Glucose, Bld: 99 mg/dL (ref 70–99)
Potassium: 3.4 mmol/L — ABNORMAL LOW (ref 3.5–5.1)
Sodium: 137 mmol/L (ref 135–145)
Total Bilirubin: 1 mg/dL (ref 0.3–1.2)
Total Protein: 9.2 g/dL — ABNORMAL HIGH (ref 6.5–8.1)

## 2019-03-28 LAB — CBC
HCT: 40.8 % (ref 36.0–46.0)
Hemoglobin: 12.4 g/dL (ref 12.0–15.0)
MCH: 24.8 pg — ABNORMAL LOW (ref 26.0–34.0)
MCHC: 30.4 g/dL (ref 30.0–36.0)
MCV: 81.4 fL (ref 80.0–100.0)
Platelets: 228 10*3/uL (ref 150–400)
RBC: 5.01 MIL/uL (ref 3.87–5.11)
RDW: 15.5 % (ref 11.5–15.5)
WBC: 8.7 10*3/uL (ref 4.0–10.5)
nRBC: 0 % (ref 0.0–0.2)

## 2019-03-28 LAB — I-STAT BETA HCG BLOOD, ED (MC, WL, AP ONLY): I-stat hCG, quantitative: <5 m[IU]/mL (ref <5)

## 2019-03-28 LAB — LIPASE, BLOOD: Lipase: 29 U/L (ref 11–51)

## 2019-03-28 MED ORDER — IOHEXOL 300 MG/ML  SOLN
80.0000 mL | Freq: Once | INTRAMUSCULAR | Status: AC | PRN
  Administered 2019-03-28: 80 mL via INTRAVENOUS

## 2019-03-28 MED ORDER — SODIUM CHLORIDE 0.9 % IV BOLUS
1000.0000 mL | Freq: Once | INTRAVENOUS | Status: AC
  Administered 2019-03-28: 1000 mL via INTRAVENOUS

## 2019-03-28 MED ORDER — MORPHINE SULFATE (PF) 2 MG/ML IV SOLN: 2.0000 mg | INTRAVENOUS | Status: DC | PRN

## 2019-03-28 MED ORDER — ONDANSETRON HCL 4 MG/2ML IJ SOLN
4.0000 mg | Freq: Once | INTRAMUSCULAR | Status: AC
  Administered 2019-03-28: 4 mg via INTRAVENOUS
  Filled 2019-03-28: qty 2

## 2019-03-28 MED ORDER — ONDANSETRON HCL 4 MG/2ML IJ SOLN: 4.0000 mg | Freq: Four times a day (QID) | INTRAMUSCULAR | Status: DC | PRN

## 2019-03-28 MED ORDER — POTASSIUM CHLORIDE 10 MEQ/100ML IV SOLN
10.0000 meq | INTRAVENOUS | Status: AC
  Administered 2019-03-28 (×4): 10 meq via INTRAVENOUS
  Filled 2019-03-28 (×4): qty 100

## 2019-03-28 MED ORDER — HYDROMORPHONE HCL 1 MG/ML IJ SOLN
0.5000 mg | Freq: Once | INTRAMUSCULAR | Status: AC
  Administered 2019-03-28: 0.5 mg via INTRAVENOUS
  Filled 2019-03-28: qty 1

## 2019-03-28 MED ORDER — SODIUM CHLORIDE (PF) 0.9 % IJ SOLN
INTRAMUSCULAR | Status: AC
  Filled 2019-03-28: qty 50

## 2019-03-28 MED ORDER — ENOXAPARIN SODIUM 40 MG/0.4ML ~~LOC~~ SOLN
40.0000 mg | SUBCUTANEOUS | Status: DC
  Administered 2019-03-28 – 2019-03-29 (×2): 40 mg via SUBCUTANEOUS
  Filled 2019-03-28 (×2): qty 0.4

## 2019-03-28 MED ORDER — LIDOCAINE HCL URETHRAL/MUCOSAL 2 % EX GEL: 1.0000 "application " | Freq: Once | CUTANEOUS | Status: DC

## 2019-03-28 MED ORDER — DEXTROSE IN LACTATED RINGERS 5 % IV SOLN
INTRAVENOUS | Status: DC
  Administered 2019-03-28 – 2019-03-29 (×2): via INTRAVENOUS

## 2019-03-28 MED ORDER — MORPHINE SULFATE (PF) 4 MG/ML IV SOLN
4.0000 mg | Freq: Once | INTRAVENOUS | Status: AC
  Administered 2019-03-28: 4 mg via INTRAVENOUS
  Filled 2019-03-28: qty 1

## 2019-03-28 MED ORDER — METOCLOPRAMIDE HCL 5 MG/ML IJ SOLN
10.0000 mg | Freq: Once | INTRAMUSCULAR | Status: AC
  Administered 2019-03-28: 10 mg via INTRAVENOUS
  Filled 2019-03-28: qty 2

## 2019-03-28 MED ORDER — SODIUM CHLORIDE 0.9% FLUSH
3.0000 mL | Freq: Once | INTRAVENOUS | Status: AC
  Administered 2019-03-28: 3 mL via INTRAVENOUS

## 2019-03-28 NOTE — ED Notes (Signed)
ED TO INPATIENT HANDOFF REPORT  Name/Age/Gender Anna OchsAlicia N Soto 28 y.o. female  Code Status   Home/SNF/Other Home  Chief Complaint abd pain  Level of Care/Admitting Diagnosis ED Disposition    ED Disposition Condition Comment   Admit  [facility] [[id number]]  Level of Care: Med-Surg [16]  Covid Evaluation: Confirmed COVID Negative  Diagnosis: Ileitis [161096][[id number]]  Admitting Physician: Albertine GratesXU, FANG [[id number]]  Attending Physician: Albertine GratesXU, FANG [[id number]]  Estimated length of stay: past midnight tomorrow  Certification:: I certify this patient will need inpatient services for at least 2 midnights  PT Class (Do Not Modify): Inpatient [101]  PT Acc Code (Do Not Modify): Private [1]       Medical History History reviewed. No pertinent past medical history.  Allergies No Known Allergies  IV Location/Drains/Wounds Patient Lines/Drains/Airways Status   Active Line/Drains/Airways    Name:   Placement date:   Placement time:   Site:   Days:   Peripheral IV 03/28/19 Left Antecubital   03/28/19    0508    Antecubital   less than 1          Labs/Imaging Results for orders placed or performed during the hospital encounter of 03/28/19 (from the past 48 hour(s))  Lipase, blood     Status: None   Collection Time: 03/28/19  4:25 AM  Result Value Ref Range   Lipase 29 11 - 51 U/L    Comment: Performed at [facility], [address] [place], [place] [address]  Comprehensive metabolic panel     Status: Abnormal   Collection Time: 03/28/19  4:25 AM  Result Value Ref Range   Sodium 137 135 - 145 mmol/L   Potassium 3.4 (L) 3.5 - 5.1 mmol/L   Chloride 104 98 - 111 mmol/L   CO2 23 22 - 32 mmol/L   Glucose, Bld 99 70 - 99 mg/dL   BUN 18 6 - 20 mg/dL   Creatinine, Ser 1.300.80 0.44 - 1.00 mg/dL   Calcium 9.3 8.9 - 86.510.3 mg/dL   Total Protein 9.2 (H) 6.5 - 8.1 g/dL   Albumin 5.0 3.5 - 5.0 g/dL   AST 18 15 - 41 U/L   ALT 10 0 - 44 U/L    Alkaline Phosphatase 53 38 - 126 U/L   Total Bilirubin 1.0 0.3 - 1.2 mg/dL   GFR calc non Af Amer >60 >60 mL/min   GFR calc Af Amer >60 >60 mL/min   Anion gap 10 5 - 15    Comment: Performed at [facility], [address] [place], [place] [address]  CBC     Status: Abnormal   Collection Time: 03/28/19  4:25 AM  Result Value Ref Range   WBC 8.7 4.0 - 10.5 K/uL   RBC 5.01 3.87 - 5.11 MIL/uL   Hemoglobin 12.4 12.0 - 15.0 g/dL   HCT 62.940.8 52.836.0 - 41.346.0 %   MCV 81.4 80.0 - 100.0 fL   MCH 24.8 (L) 26.0 - 34.0 pg   MCHC 30.4 30.0 - 36.0 g/dL   RDW 24.415.5 01.011.5 - 27.215.5 %   Platelets 228 150 - 400 K/uL   nRBC 0.0 0.0 - 0.2 %    Comment: Performed at [facility], [address] [place], [place] [address]  Urinalysis, Routine w reflex microscopic     Status: Abnormal   Collection Time: 03/28/19  4:25 AM  Result Value Ref Range   Color, Urine YELLOW YELLOW  APPearance HAZY (A) CLEAR   Specific Gravity, Urine 1.029 1.005 - 1.030   pH 5.0 5.0 - 8.0   Glucose, UA NEGATIVE NEGATIVE mg/dL   Hgb urine dipstick NEGATIVE NEGATIVE   Bilirubin Urine NEGATIVE NEGATIVE   Ketones, ur NEGATIVE NEGATIVE mg/dL   Protein, ur NEGATIVE NEGATIVE mg/dL   Nitrite NEGATIVE NEGATIVE   Leukocytes,Ua NEGATIVE NEGATIVE    Comment: Performed at [facility] [address] [place], [place] [address]  I-Stat beta hCG blood, ED     Status: None   Collection Time: 03/28/19  5:11 AM  Result Value Ref Range   I-stat hCG, quantitative <5.0 <5 mIU/mL   Comment 3            Comment:   GEST. AGE      CONC.  (mIU/mL)   <=1 WEEK        5 - 50     2 WEEKS       50 - 500     3 WEEKS       100 - 10,000     4 WEEKS     1,000 - 30,000        FEMALE AND NON-PREGNANT FEMALE:     LESS THAN 5 mIU/mL    Ct Abdomen Pelvis W Contrast  Result Date: 03/28/2019 CLINICAL DATA:  Periumbilical abdominal pain, nausea and vomiting for 12 hours. 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 EXAM: CT ABDOMEN AND PELVIS WITH CONTRAST TECHNIQUE: Multidetector CT imaging of the abdomen and pelvis was performed using the standard protocol following bolus administration of intravenous contrast. CONTRAST:  51mL OMNIPAQUE IOHEXOL 300 MG/ML  SOLN COMPARISON:  None. FINDINGS: Lower chest: No significant pulmonary nodules or acute consolidative airspace disease. Hepatobiliary: Normal liver size. No liver mass. Normal gallbladder with no radiopaque cholelithiasis. No biliary ductal dilatation. Pancreas: Normal, with no mass or duct dilation. Spleen: Normal size. No mass. Adrenals/Urinary Tract: Normal adrenals. Normal kidneys with no hydronephrosis and no renal mass. Normal bladder. Stomach/Bowel: Normal non-distended stomach. There is mild-to-moderate dilatation of fluid-filled mid to distal small bowel loops up to 4.5 cm diameter. There is stool sign in the distal ileum. There is wall thickening and mucosal hyperenhancement in the terminal ileum. There is free fluid surrounding the dilated small bowel loops in the right lower quadrant. Appendix is not discretely visualized. No large bowel wall thickening or significant diverticulosis. Vascular/Lymphatic: Normal caliber abdominal aorta. Patent portal, splenic, hepatic and renal veins. No pathologically enlarged lymph nodes in the abdomen or pelvis. Reproductive: Grossly normal uterus.  No adnexal mass. Other: No pneumoperitoneum. Small volume free fluid in the pelvis. No focal fluid collection. Musculoskeletal: No aggressive appearing focal osseous lesions. IMPRESSION: 1. Wall thickening and mucosal hyperenhancement in the terminal ileum suggesting nonspecific infectious or inflammatory terminal ileitis, with the differential including inflammatory bowel disease. Please note that the appendix is not discretely visualized and an inflammatory process involving the appendix is difficult to exclude. 2. Mild-to-moderate dilatation of the fluid-filled mid to  distal small bowel with stool sign in the distal small bowel indicative of stasis. Findings suggest functional distal small bowel obstruction. 3. Prominent edema surrounding the dilated small bowel loops in the right lower quadrant, presumably inflammatory. No focal fluid collections. No free air. Electronically Signed   By: Ilona Sorrel M.D.   On: 03/28/2019 08:26    Pending Labs Unresulted Labs (From admission, onward)    Start     Ordered   03/28/19 [phone]  SARS CORONAVIRUS 2 Nasal Swab Aptima Multi Swab  (Asymptomatic/Tier 2 Patients Labs)  Once,   STAT    Question Answer Comment  Is this test for diagnosis or screening Screening   Symptomatic for COVID-19 as defined by CDC No   Hospitalized for COVID-19 No   Admitted to ICU for COVID-19 No   Previously tested for COVID-19 No   Resident in a congregate (group) care setting No   Employed in healthcare setting No   Pregnant No      03/28/19 0906          Vitals/Pain Today's Vitals   03/28/19 0720 03/28/19 0754 03/28/19 0800 03/28/19 0900  BP:  114/78 109/76 117/86  Pulse:  88 97 (!) 101  Resp:  13    Temp:  98.4 F (36.9 C)    TempSrc:  Oral    SpO2:  99% 100% 100%  PainSc: 7  3       Isolation Precautions No active isolations  Medications Medications  sodium chloride (PF) 0.9 % injection (has no administration in time range)  lidocaine (XYLOCAINE) 2 % jelly 1 application (has no administration in time range)  sodium chloride flush (NS) 0.9 % injection 3 mL (3 mLs Intravenous Given 03/28/19 0515)  HYDROmorphone (DILAUDID) injection 0.5 mg (0.5 mg Intravenous Given 03/28/19 0542)  ondansetron (ZOFRAN) injection 4 mg (4 mg Intravenous Given 03/28/19 0541)  morphine 4 MG/ML injection 4 mg (4 mg Intravenous Given 03/28/19 0724)  metoCLOPramide (REGLAN) injection 10 mg (10 mg Intravenous Given 03/28/19 0724)  iohexol (OMNIPAQUE) 300 MG/ML solution 80 mL (80 mLs Intravenous Contrast Given 03/28/19 0734)    Mobility walks

## 2019-03-28 NOTE — ED Provider Notes (Signed)
[facility]-EMER[facility]PT Provider Note   CSN: [id number] Arrival date & time: 03/28/19  0355     History   Chief Complaint Chief Complaint  Patient presents with  . Abdominal Pain    HPI Laureen Ochslicia N Jafari is a 28 y.o. female.     Patient to ED with central and epigastric abdominal pain for the past 12 hours. Pain is intermittent, associated with nausea and vomiting. No diarrhea or fever. Last bowel movement approximately 20 hours ago. She is not passing gas. She denies feeling bloated or distended. No chest pain, SOB, urinary symptoms, vaginal discharge. LMP 03/11/19. Per mom, she had bowel surgery as a child.   The history is provided by the patient and a parent. No language interpreter was used.  Abdominal Pain Associated symptoms: nausea and vomiting   Associated symptoms: no chest pain, no diarrhea, no fever and no shortness of breath     History reviewed. No pertinent past medical history.  Patient Active Problem List   Diagnosis Date Noted  . Migraine 10/10/2016    Past Surgical History:  Procedure Laterality Date  . SMALL INTESTINE SURGERY  1993     OB History    Gravida  0   Para  0   Term  0   Preterm  0   AB  0   Living  0     SAB  0   TAB  0   Ectopic  0   Multiple  0   Live Births  0            Home Medications    Prior to Admission medications   Medication Sig Start Date End Date Taking? Authorizing Provider  diphenhydrAMINE HCl, Sleep, (UNISOM SLEEPGELS) 50 MG CAPS Take 50 mg by mouth at bedtime as needed (sleep).   Yes [provider]  azithromycin (ZITHROMAX) 250 MG tablet Take 2 tabs PO x 1 dose, then 1 tab PO QD x 4 days Patient not taking: Reported on 03/28/2019 11/01/16   Benjiman CoreWiseman, Brittany D, PA-C  benzonatate (TESSALON) 100 MG capsule Take 1-2 capsules (100-200 mg total) by mouth 3 (three) times daily as needed for cough. Patient not taking: Reported on 03/28/2019 11/01/16   Benjiman CoreWiseman, Brittany D,  PA-C  chlorpheniramine-HYDROcodone [facility](TUSSIONEX PENNKINETIC ER) 10-8 MG/5ML SUER Take 5 mLs by mouth every 12 (twelve) hours as needed for cough. Patient not taking: Reported on 03/28/2019 11/01/16   Benjiman CoreWiseman, Brittany D, PA-C  fluconazole (DIFLUCAN) 200 MG tablet 2 tablets once a week Patient not taking: Reported on 03/28/2019 08/20/17   Elson AreasSofia, Leslie K, PA-C  fluticasone [facility](FLONASE) 50 MCG/ACT nasal spray Place 2 sprays into both nostrils daily. Patient not taking: Reported on 03/28/2019 09/24/16   McVey, Madelaine BhatElizabeth Whitney, PA-C  topiramate (TOPAMAX) 100 MG tablet Take 1 tablet (100 mg total) by mouth daily. Patient not taking: Reported on 03/28/2019 12/21/16   McVey, Madelaine BhatElizabeth Whitney, PA-C    Family History Family History  Problem Relation Age of Onset  . Hypertension Mother   . Hypertension Maternal Aunt   . Hypertension Maternal Uncle   . Hypertension Paternal Aunt   . Hypertension Paternal Uncle   . Diabetes Maternal Grandmother   . Diabetes Maternal Grandfather   . Diabetes Paternal Grandmother   . Diabetes Paternal Grandfather     Social History Social History   Tobacco Use  . Smoking status: Never Smoker  . Smokeless tobacco: Never Used  Substance Use Topics  . Alcohol use:  No    Alcohol/week: 0.0 standard drinks  . Drug use: No     Allergies   Patient has no known allergies.   Review of Systems Review of Systems  Constitutional: Negative for fever.  Respiratory: Negative for shortness of breath.   Cardiovascular: Negative for chest pain.  Gastrointestinal: Positive for abdominal pain, nausea and vomiting. Negative for diarrhea.  Genitourinary: Negative.   Musculoskeletal: Negative.      Physical Exam Updated Vital Signs BP (!) 129/91 (BP Location: Left Arm)   Pulse (!) 108   Temp 99.1 F (37.3 C) (Oral)   SpO2 99%   Physical Exam Vitals signs and nursing note reviewed.  Constitutional:      Appearance: She is well-developed.  HENT:     Head: Normocephalic.   Neck:     Musculoskeletal: Normal range of motion and neck supple.  Cardiovascular:     Rate and Rhythm: Normal rate and regular rhythm.  Pulmonary:     Effort: Pulmonary effort is normal.     Breath sounds: Normal breath sounds.  Abdominal:     General: Bowel sounds are decreased. There is no distension.     Palpations: Abdomen is soft.     Tenderness: There is generalized abdominal tenderness. There is guarding. There is no rebound.  Musculoskeletal: Normal range of motion.  Skin:    General: Skin is warm and dry.     Findings: No rash.  Neurological:     Mental Status: She is alert.     Cranial Nerves: No cranial nerve deficit.      ED Treatments / Results  Labs (all labs ordered are listed, but only abnormal results are displayed) Labs Reviewed  LIPASE, BLOOD  COMPREHENSIVE METABOLIC PANEL  CBC  URINALYSIS, ROUTINE W REFLEX MICROSCOPIC  I-STAT BETA HCG BLOOD, ED ([facility], [facility], [facility] ONLY)    EKG None  Radiology No results found.  Procedures Procedures (including critical care time)  Medications Ordered in ED Medications  sodium chloride flush (NS) 0.9 % injection 3 mL (has no administration in time range)     Initial Impression / Assessment and Plan / ED Course  I have reviewed the triage vital signs and the nursing notes.  Pertinent labs & imaging results that were available during my care of the patient were reviewed by me and considered in my medical decision making (see chart for details).        Patient to ED with abdominal pain, N, V. No diarrhea, fever, hematemesis. History of previous bowel surgery in childhood.   Pain/nausea medication provided. Labs pending. CT scan ordered based on acute abdominal pain, vomiting, h/o bowel surgery remotely.   7:00 - patient's pain uncontrolled. She is vomiting. Attempting to drink oral contrast. Additional medications ordered.   Patient care signed out to [facility], PA-C.    Final Clinical Impressions(s)  / ED Diagnoses   Final diagnoses:  None   1. Abdominal pain  ED Discharge Orders    None       Charlann Lange, Hershal Coria 03/28/19 0703    Fatima Blank, MD 03/28/19 [phone]

## 2019-03-28 NOTE — ED Notes (Signed)
She tells me she feels "better". Her mother remains with her.

## 2019-03-28 NOTE — ED Provider Notes (Signed)
Care assumed from [facility], [place], at shift change, please see their notes for full documentation of patient's complaint/HPI. Briefly, pt here with periumbilical/epigastric abdominal pain, nausea, vomiting, and obstipation that began approximately 12 hours ago. Results so far show no leukocytosis, stable hgb, no electrolyte abnormalities, negative lipase, and noninfectious U/A. Awaiting CT A/P given guarding with abdominal pain. Plan is to dispo patient accordingly.    Physical Exam  BP (!) 129/91 (BP Location: Left Arm)   Pulse (!) 108   Temp 99.1 F (37.3 C) (Oral)   SpO2 99%   Physical Exam  ED Course/Procedures     Procedures  MDM  CT scan consistent with findings of ileitis with functional small bowel obstruction. Question inflammation vs infection; given patient afebrile and without leukocytosis more consistent with inflammation. Pt feeling improved after 0.5 mg dilaudid, 4 mg morphine, 4 mg zofran, and 10 mg reglan. She still is tender to palpation to epigastrium although without guarding now. Will proceed with NG tube placement to help decompress bowel and discuss case with hospitalist for admission for bowel rest/further workup.   Discussed case with Dr. Erlinda Hong who agrees to accept for admission.        Eustaquio Maize, PA-C 03/28/19 1618    Julianne Rice, MD 04/03/19 [phone]

## 2019-03-28 NOTE — Consult Note (Signed)
Reason for Consult: Abnormal CT Referring Physician: Hospital team  Anna Soto is an 28 y.o. female.  HPI: Patient seen and examined in her hospital computer chart reviewed and her case discussed with the hospital team as well as her mother and she was in her normal state of health until last night when she had increased vomiting and abdominal spasms and when it continued she went to the emergency room and since she has been admitted she feels much better and has had no further complaints she has not had any previous GI work-up and her family history is negative for any GI issues or autoimmune problems and specifically she has had no fever chills night sweats no weight changes no skin eye or joint complaints and minimizes aspirin and nonsteroidals and has no other complaints and her surgery as an infant was discussed with her mother as well  History reviewed. No pertinent past medical history.  Past Surgical History:  Procedure Laterality Date  . SMALL INTESTINE SURGERY  1993    Family History  Problem Relation Age of Onset  . Hypertension Mother   . Hypertension Maternal Aunt   . Hypertension Maternal Uncle   . Hypertension Paternal Aunt   . Hypertension Paternal Uncle   . Diabetes Maternal Grandmother   . Diabetes Maternal Grandfather   . Diabetes Paternal Grandmother   . Diabetes Paternal Grandfather     Social History:  reports that she has never smoked. She has never used smokeless tobacco. She reports that she does not drink alcohol or use drugs.  Allergies: No Known Allergies  Medications: I have reviewed the patient's current medications.  Results for orders placed or performed during the hospital encounter of 03/28/19 (from the past 48 hour(s))  Lipase, blood     Status: None   Collection Time: 03/28/19  4:25 AM  Result Value Ref Range   Lipase 29 11 - 51 U/L    Comment: Performed at [facility], [address] [place], [place] [address]   Comprehensive metabolic panel     Status: Abnormal   Collection Time: 03/28/19  4:25 AM  Result Value Ref Range   Sodium 137 135 - 145 mmol/L   Potassium 3.4 (L) 3.5 - 5.1 mmol/L   Chloride 104 98 - 111 mmol/L   CO2 23 22 - 32 mmol/L   Glucose, Bld 99 70 - 99 mg/dL   BUN 18 6 - 20 mg/dL   Creatinine, Ser 2.950.80 0.44 - 1.00 mg/dL   Calcium 9.3 8.9 - 28.410.3 mg/dL   Total Protein 9.2 (H) 6.5 - 8.1 g/dL   Albumin 5.0 3.5 - 5.0 g/dL   AST 18 15 - 41 U/L   ALT 10 0 - 44 U/L   Alkaline Phosphatase 53 38 - 126 U/L   Total Bilirubin 1.0 0.3 - 1.2 mg/dL   GFR calc non Af Amer >60 >60 mL/min   GFR calc Af Amer >60 >60 mL/min   Anion gap 10 5 - 15    Comment: Performed at [facility], [address] [place], [place] [address]  CBC     Status: Abnormal   Collection Time: 03/28/19  4:25 AM  Result Value Ref Range   WBC 8.7 4.0 - 10.5 K/uL   RBC 5.01 3.87 - 5.11 MIL/uL   Hemoglobin 12.4 12.0 - 15.0 g/dL   HCT 01.040.8 27.236.0 - 53.646.0 %   MCV 81.4 80.0 - 100.0 fL   MCH 24.8 (L)  26.0 - 34.0 pg   MCHC 30.4 30.0 - 36.0 g/dL   RDW 60.415.5 54.011.5 - 98.115.5 %   Platelets 228 150 - 400 K/uL   nRBC 0.0 0.0 - 0.2 %    Comment: Performed at [facility], [address] [place], [place] [address]  Urinalysis, Routine w reflex microscopic     Status: Abnormal   Collection Time: 03/28/19  4:25 AM  Result Value Ref Range   Color, Urine YELLOW YELLOW   APPearance HAZY (A) CLEAR   Specific Gravity, Urine 1.029 1.005 - 1.030   pH 5.0 5.0 - 8.0   Glucose, UA NEGATIVE NEGATIVE mg/dL   Hgb urine dipstick NEGATIVE NEGATIVE   Bilirubin Urine NEGATIVE NEGATIVE   Ketones, ur NEGATIVE NEGATIVE mg/dL   Protein, ur NEGATIVE NEGATIVE mg/dL   Nitrite NEGATIVE NEGATIVE   Leukocytes,Ua NEGATIVE NEGATIVE    Comment: Performed at [facility], [address] [place], [place] [address]  I-Stat beta hCG blood, ED     Status: None   Collection Time: 03/28/19  5:11 AM   Result Value Ref Range   I-stat hCG, quantitative <5.0 <5 mIU/mL   Comment 3            Comment:   GEST. AGE      CONC.  (mIU/mL)   <=1 WEEK        5 - 50     2 WEEKS       50 - 500     3 WEEKS       100 - 10,000     4 WEEKS     1,000 - 30,000        FEMALE AND NON-PREGNANT FEMALE:     LESS THAN 5 mIU/mL     Ct Abdomen Pelvis W Contrast  Result Date: 03/28/2019 CLINICAL DATA:  Periumbilical abdominal pain, nausea and vomiting for 12 hours. 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 EXAM: CT ABDOMEN AND PELVIS WITH CONTRAST TECHNIQUE: Multidetector CT imaging of the abdomen and pelvis was performed using the standard protocol following bolus administration of intravenous contrast. CONTRAST:  80mL OMNIPAQUE IOHEXOL 300 MG/ML  SOLN COMPARISON:  None. FINDINGS: Lower chest: No significant pulmonary nodules or acute consolidative airspace disease. Hepatobiliary: Normal liver size. No liver mass. Normal gallbladder with no radiopaque cholelithiasis. No biliary ductal dilatation. Pancreas: Normal, with no mass or duct dilation. Spleen: Normal size. No mass. Adrenals/Urinary Tract: Normal adrenals. Normal kidneys with no hydronephrosis and no renal mass. Normal bladder. Stomach/Bowel: Normal non-distended stomach. There is mild-to-moderate dilatation of fluid-filled mid to distal small bowel loops up to 4.5 cm diameter. There is stool sign in the distal ileum. There is wall thickening and mucosal hyperenhancement in the terminal ileum. There is free fluid surrounding the dilated small bowel loops in the right lower quadrant. Appendix is not discretely visualized. No large bowel wall thickening or significant diverticulosis. Vascular/Lymphatic: Normal caliber abdominal aorta. Patent portal, splenic, hepatic and renal veins. No pathologically enlarged lymph nodes in the abdomen or pelvis. Reproductive: Grossly normal uterus.  No adnexal mass. Other: No pneumoperitoneum. Small volume free fluid in the pelvis. No  focal fluid collection. Musculoskeletal: No aggressive appearing focal osseous lesions. IMPRESSION: 1. Wall thickening and mucosal hyperenhancement in the terminal ileum suggesting nonspecific infectious or inflammatory terminal ileitis, with the differential including inflammatory bowel disease. Please note that the appendix is not discretely visualized and an inflammatory process involving the appendix is difficult to exclude. 2. Mild-to-moderate  dilatation of the fluid-filled mid to distal small bowel with stool sign in the distal small bowel indicative of stasis. Findings suggest functional distal small bowel obstruction. 3. Prominent edema surrounding the dilated small bowel loops in the right lower quadrant, presumably inflammatory. No focal fluid collections. No free air. Electronically Signed   By: Ilona Sorrel M.D.   On: 03/28/2019 08:26    Review of Systems  Neurological: Positive for seizures.  Blood pressure 129/82, pulse 89, temperature 98.7 F (37.1 C), temperature source Oral, resp. rate 18, SpO2 100 %. 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

## 2019-03-28 NOTE — ED Notes (Signed)
I just gave phone report to the nurse on [place]. Will transport shortly.

## 2019-03-28 NOTE — H&P (Addendum)
History and Physical  Anna Soto WGN:[id number] DOB: 09-16-1990 DOA: 03/28/2019  Referring physician: EDP PCP: Hoyt Koch, MD   Chief Complaint: n/v, ab pain  HPI: Anna Soto is a 28 y.o. 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  ED course: vital signa are stable, no fever. Lab: cbc unremarkable, cmp unremarkable excepted mild hypokalemia with k at 3.4, lipase wnl. Pregnancy test negative CT + terminal ielitis, functional small bowel obstruction  CT ab/pel: 1. Wall thickening and mucosal hyperenhancement in the terminal ileum suggesting nonspecific infectious or inflammatory terminal ileitis, with the differential including inflammatory bowel disease. Please note that the appendix is not discretely visualized and an inflammatory process involving the appendix is difficult to exclude. 2. Mild-to-moderate dilatation of the fluid-filled mid to distal small bowel with stool sign in the distal small bowel indicative of stasis. Findings suggest functional distal small bowel obstruction. 3. Prominent edema surrounding the dilated small bowel loops in the right lower quadrant, presumably inflammatory. No focal fluid collections. No free air.  She received analgesics and antiemetics, hospitalist called to admit the patient.   She vomited 4times in the ED , last around 6:30, currently she feeling better, ab pain is minimal, no active n/v,    SARSCov2 pending collectin, no reports of respiratory symptom  Review of Systems:  Detail per HPI, Review of systems are otherwise negative  History reviewed. No pertinent past medical history. Past Surgical History:  Procedure Laterality Date  . SMALL INTESTINE SURGERY  1993   Social History:  reports that she has never smoked. She has never used smokeless tobacco. She reports  that she does not drink alcohol or use drugs. Patient lives at home & is able to participate in activities of daily living independently   No Known Allergies  Family History  Problem Relation Age of Onset  . Hypertension Mother   . Hypertension Maternal Aunt   . Hypertension Maternal Uncle   . Hypertension Paternal Aunt   . Hypertension Paternal Uncle   . Diabetes Maternal Grandmother   . Diabetes Maternal Grandfather   . Diabetes Paternal Grandmother   . Diabetes Paternal Grandfather       Prior to Admission medications   Medication Sig Start Date End Date Taking? Authorizing Provider  diphenhydrAMINE HCl, Sleep, (UNISOM SLEEPGELS) 50 MG CAPS Take 50 mg by mouth at bedtime as needed (sleep).   Yes [provider]  azithromycin (ZITHROMAX) 250 MG tablet Take 2 tabs PO x 1 dose, then 1 tab PO QD x 4 days Patient not taking: Reported on 03/28/2019 11/01/16   Tenna Delaine D, PA-C  benzonatate (TESSALON) 100 MG capsule Take 1-2 capsules (100-200 mg total) by mouth 3 (three) times daily as needed for cough. Patient not taking: Reported on 03/28/2019 11/01/16   Tenna Delaine D, PA-C  chlorpheniramine-HYDROcodone [facility]) 10-8 MG/5ML SUER Take 5 mLs by mouth every 12 (twelve) hours as needed for cough. Patient not taking: Reported on 03/28/2019 11/01/16   Tenna Delaine D, PA-C  fluconazole (DIFLUCAN) 200 MG tablet 2 tablets once a week Patient not taking: Reported on 03/28/2019 08/20/17   Fransico Meadow, PA-C  fluticasone [facility]) 50 MCG/ACT nasal spray Place 2 sprays into both nostrils daily. Patient not taking: Reported on 03/28/2019 09/24/16   McVey, Gelene Mink,  PA-C  topiramate (TOPAMAX) 100 MG tablet Take 1 tablet (100 mg total) by mouth daily. Patient not taking: Reported on 03/28/2019 12/21/16   McVey, Gelene Mink, PA-C    Physical Exam: BP 114/78 (BP Location: Right Arm)   Pulse 88   Temp 98.4 F (36.9 C) (Oral)   Resp 13   SpO2 99%   .  General:  Slightly Dehydrated  . Eyes: PERRL . ENT: unremarkable . Neck: supple, no JVD . Cardiovascular: sinus tachycardia . Respiratory: CTABL . Abdomen: mild diffuse tender right side and periumbilcal region, no guarding, ND, decreased bowel sounds . Skin: no rash . Musculoskeletal:  No edema . Psychiatric: calm/cooperative . Neurologic: no focal findings            Labs on Admission:  Basic Metabolic Panel: Recent Labs  Lab 03/28/19 0425  NA 137  K 3.4*  CL 104  CO2 23  GLUCOSE 99  BUN 18  CREATININE 0.80  CALCIUM 9.3   Liver Function Tests: Recent Labs  Lab 03/28/19 0425  AST 18  ALT 10  ALKPHOS 53  BILITOT 1.0  PROT 9.2*  ALBUMIN 5.0   Recent Labs  Lab 03/28/19 0425  LIPASE 29   No results for input(s): AMMONIA in the last 168 hours. CBC: Recent Labs  Lab 03/28/19 0425  WBC 8.7  HGB 12.4  HCT 40.8  MCV 81.4  PLT 228   Cardiac Enzymes: No results for input(s): CKTOTAL, CKMB, CKMBINDEX, TROPONINI in the last 168 hours.  BNP (last 3 results) No results for input(s): BNP in the last 8760 hours.  ProBNP (last 3 results) No results for input(s): PROBNP in the last 8760 hours.  CBG: No results for input(s): GLUCAP in the last 168 hours.  Radiological Exams on Admission: Ct Abdomen Pelvis W Contrast  Result Date: 03/28/2019 CLINICAL DATA:  Periumbilical abdominal pain, nausea and vomiting for 12 hours. 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 EXAM: CT ABDOMEN AND PELVIS WITH CONTRAST TECHNIQUE: Multidetector CT imaging of the abdomen and pelvis was performed using the standard protocol following bolus administration of intravenous contrast. CONTRAST:  26m OMNIPAQUE IOHEXOL 300 MG/ML  SOLN COMPARISON:  None. FINDINGS: Lower chest: No significant pulmonary nodules or acute consolidative airspace disease. Hepatobiliary: Normal liver size. No liver mass. Normal gallbladder with no radiopaque cholelithiasis. No biliary ductal dilatation. Pancreas: Normal,  with no mass or duct dilation. Spleen: Normal size. No mass. Adrenals/Urinary Tract: Normal adrenals. Normal kidneys with no hydronephrosis and no renal mass. Normal bladder. Stomach/Bowel: Normal non-distended stomach. There is mild-to-moderate dilatation of fluid-filled mid to distal small bowel loops up to 4.5 cm diameter. There is stool sign in the distal ileum. There is wall thickening and mucosal hyperenhancement in the terminal ileum. There is free fluid surrounding the dilated small bowel loops in the right lower quadrant. Appendix is not discretely visualized. No large bowel wall thickening or significant diverticulosis. Vascular/Lymphatic: Normal caliber abdominal aorta. Patent portal, splenic, hepatic and renal veins. No pathologically enlarged lymph nodes in the abdomen or pelvis. Reproductive: Grossly normal uterus.  No adnexal mass. Other: No pneumoperitoneum. Small volume free fluid in the pelvis. No focal fluid collection. Musculoskeletal: No aggressive appearing focal osseous lesions. IMPRESSION: 1. Wall thickening and mucosal hyperenhancement in the terminal ileum suggesting nonspecific infectious or inflammatory terminal ileitis, with the differential including inflammatory bowel disease. Please note that the appendix is not discretely visualized and an inflammatory process involving the appendix is difficult to exclude. 2. Mild-to-moderate dilatation of the  fluid-filled mid to distal small bowel with stool sign in the distal small bowel indicative of stasis. Findings suggest functional distal small bowel obstruction. 3. Prominent edema surrounding the dilated small bowel loops in the right lower quadrant, presumably inflammatory. No focal fluid collections. No free air. Electronically Signed   By: Ilona Sorrel M.D.   On: 03/28/2019 08:26      Assessment/Plan Present on Admission: . Ileitis  Terminal ileitis /funtional sbo with stool signs in distal small bowel -npo except ice chips, ng  placement if she vomits again, last vomited around 6:30 am -ivf, analgesics, antiemetics - GI consult,  will hold off Abx,steroids, enema for now , as etiology unclear, patient does not have fever, no leukocytosis, she is hemodynamically stable except slight tachycardia most likely related to stress and dehydration -will check esr/crp  Hypokalemia: replace k, check mag   DVT prophylaxis: lovenox  Consultants: case discussed with [facility] Watt Climes   Code Status: full   Family Communication:  Patient  And mother at bedside  Disposition Plan: admit to med surg  Time spent: 6mns  FFlorencia ReasonsMD, PhD, FACP [facility] Pager [phone] 7PM-7AM, please contact night-coverage at [email or web address], password [facility]

## 2019-03-28 NOTE — ED Notes (Signed)
Attempted NG tube rt nares #16 tube, met resistance, due to this tube was not inserted

## 2019-03-28 NOTE — ED Triage Notes (Signed)
Pt reports umbilical abdominal pain that started around 530p yesterday. She endorses nausea and vomiting as well. Denies hematuria or diarrhea. No fever or sick contacts.

## 2019-03-29 ENCOUNTER — Encounter (HOSPITAL_COMMUNITY): Payer: Self-pay | Admitting: *Deleted

## 2019-03-29 LAB — COMPREHENSIVE METABOLIC PANEL
ALT: 7 U/L (ref 0–44)
AST: 13 U/L — ABNORMAL LOW (ref 15–41)
Albumin: 3.4 g/dL — ABNORMAL LOW (ref 3.5–5.0)
Alkaline Phosphatase: 39 U/L (ref 38–126)
Anion gap: 6 (ref 5–15)
BUN: 7 mg/dL (ref 6–20)
CO2: 24 mmol/L (ref 22–32)
Calcium: 8.5 mg/dL — ABNORMAL LOW (ref 8.9–10.3)
Chloride: 108 mmol/L (ref 98–111)
Creatinine, Ser: 0.64 mg/dL (ref 0.44–1.00)
GFR calc Af Amer: >60 mL/min (ref >60)
GFR calc non Af Amer: >60 mL/min (ref >60)
Glucose, Bld: 97 mg/dL (ref 70–99)
Potassium: 3.9 mmol/L (ref 3.5–5.1)
Sodium: 138 mmol/L (ref 135–145)
Total Bilirubin: 1 mg/dL (ref 0.3–1.2)
Total Protein: 6.2 g/dL — ABNORMAL LOW (ref 6.5–8.1)

## 2019-03-29 LAB — CBC
HCT: 31.4 % — ABNORMAL LOW (ref 36.0–46.0)
Hemoglobin: 9.3 g/dL — ABNORMAL LOW (ref 12.0–15.0)
MCH: 24.3 pg — ABNORMAL LOW (ref 26.0–34.0)
MCHC: 29.6 g/dL — ABNORMAL LOW (ref 30.0–36.0)
MCV: 82.2 fL (ref 80.0–100.0)
Platelets: 171 10*3/uL (ref 150–400)
RBC: 3.82 MIL/uL — ABNORMAL LOW (ref 3.87–5.11)
RDW: 15.4 % (ref 11.5–15.5)
WBC: 3.5 10*3/uL — ABNORMAL LOW (ref 4.0–10.5)
nRBC: 0 % (ref 0.0–0.2)

## 2019-03-29 LAB — C-REACTIVE PROTEIN: CRP: <0.8 mg/dL (ref <1.0)

## 2019-03-29 LAB — SARS CORONAVIRUS 2 (TAT 6-24 HRS): SARS Coronavirus 2: NEGATIVE

## 2019-03-29 LAB — SEDIMENTATION RATE: Sed Rate: 13 mm/hr (ref 0–22)

## 2019-03-29 LAB — MAGNESIUM: Magnesium: 2.1 mg/dL (ref 1.7–2.4)

## 2019-03-29 LAB — HIV ANTIBODY (ROUTINE TESTING W REFLEX): HIV Screen 4th Generation wRfx: NONREACTIVE

## 2019-03-29 NOTE — Progress Notes (Addendum)
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 and if she tolerates a soft diet here can go home either later today or tomorrow and her mother agrees with the plan as well  [facility] E  office [phone] After 5PM or if no answer call [phone]

## 2019-03-29 NOTE — Discharge Summary (Signed)
Physician Discharge Summary  Laureen Ochslicia N Giuliano LKG:[id number]:[id number] DOB: 11-17-1990 DOA: 03/28/2019  PCP: Myrlene Brokerrawford, Elizabeth A, MD  Admit date: 03/28/2019 Discharge date: 03/29/2019  Admitted From: Home Discharge disposition: Home   Code Status: Full Code   Recommendations for Outpatient Follow-Up:   1. 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 Patient was in her normal state of health until last night when she started having increased vomiting and abdominal spasms.  Symptoms continued and patient presented to the ED.  In the ED, no fever, hemodynamically stable.  Lab: cbc unremarkable, cmp unremarkable except mild hypokalemia with k at 3.4, lipase wnl. Pregnancy test negative  CT scan of abdomen and pelvis was obtained with findings as below: 1. Wall thickening and mucosal hyperenhancement in the terminal ileum suggesting nonspecific infectious or inflammatory terminal ileitis, with the differential including inflammatory bowel disease.  2. Mild-to-moderate dilatation of the fluid-filled mid to distal small bowel with stool sign in the distal small bowel indicative of stasis. Findings suggest functional distal small bowel obstruction. 3. Prominent edema surrounding the dilated small bowel loops in the right lower quadrant, presumably inflammatory. No focal fluid collections. No free air.  Her symptoms started improving while in the ED. GI consultation was obtained. She was admitted under hospitalist service for terminal ileitis/functional SBO. She was started on conservative management.  Subjective:  Patient was seen and examined this morning.  Pleasant young African-American female.  Sitting up in bed.  Not in distress.  Abdominal pain improved.  GI follow-up  appreciated.  Diet advanced to soft.  Patient tolerated well.  Ready for discharge to home today.  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 -CT scan finding as above suspecting possible bowel obstruction.  However, patient's symptoms have resolved.  No abdominal pain.  Had a bowel movement this morning without any blood. Able to tolerate soft diet this morning. GI follow-up appreciated. Okay discharged home today.  Per GI recommendation, she will slowly advance her diet.  She will have an outpatient colonoscopy later this week or next week.  She has been suggested to avoid nuts, seeds, popcorn and uncooked vegetables, fried foods.  Hemoglobin drop -Hemoglobin 9.3 this morning.  Was 12 yesterday.  No active bleeding.  Likely dilutional.  Follow-up with PCP as an outpatient.  Patient was initially admitted under inpatient status yesterday.  She was expected to have more than 2 midnight stay because of bowel obstruction.  With conservative management, IV fluid, pain medicine and antiemetics, she improved faster than expected.  She is a young patient without other comorbidities.  So hospital course ended up being short.  She is ready for discharge to home today.  Discharge Exam:   Vitals:   03/28/19 1108 03/28/19 2111 03/29/19 0525 03/29/19 1313  BP: 129/82 119/75 112/76 119/72  Pulse: 89 72 78 93  Resp: 18 14 18    Temp: 98.7 F (37.1 C) 98.3 F (36.8 C) 98.1 F (36.7 C)   TempSrc: Oral Oral Oral   SpO2: 100% 100% 100% 100%    There is no height or weight on file to calculate BMI.  General exam: Appears calm and comfortable.  Skin: No rashes, lesions or ulcers. HEENT: Atraumatic, normocephalic, supple neck, no obvious bleeding Lungs:  Clear to auscultation bilaterally CVS: Regular rate and rhythm, no murmur GI/Abd soft, nontender, nondistended, bowel sound present CNS: Alert, awake, oriented x3  Psychiatry: Mood appropriate Extremities: No pedal edema, no calf tenderness  Discharge Instructions:  Wound care: None Discharge Instructions    Discharge diet:   Complete by: As directed    Soft, advance gradually   Increase activity slowly   Complete by: As directed       Allergies as of 03/29/2019   No Known Allergies     Medication List    STOP taking these medications   azithromycin 250 MG tablet Commonly known as: ZITHROMAX   benzonatate 100 MG capsule Commonly known as: TESSALON   chlorpheniramine-HYDROcodone 10-8 MG/5ML Suer Commonly known as: Tussionex Pennkinetic ER   fluconazole 200 MG tablet Commonly known as: DIFLUCAN   fluticasone 50 MCG/ACT nasal spray Commonly known as: FLONASE   topiramate 100 MG tablet Commonly known as: Topamax     TAKE these medications   Unisom Sleepgels 50 MG Caps Generic drug: diphenhydrAMINE HCl (Sleep) Take 50 mg by mouth at bedtime as needed (sleep).       Time coordinating discharge: 25 minutes  The results of significant diagnostics from this hospitalization (including imaging, microbiology, ancillary and laboratory) are listed below for reference.    Procedures and Diagnostic Studies:   Ct Abdomen Pelvis W Contrast  Result Date: 03/28/2019 CLINICAL DATA:  Periumbilical abdominal pain, nausea and vomiting for 12 hours. 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 EXAM: CT ABDOMEN AND PELVIS WITH CONTRAST TECHNIQUE: Multidetector CT imaging of the abdomen and pelvis was performed using the standard protocol following bolus administration of intravenous contrast. CONTRAST:  80mL OMNIPAQUE IOHEXOL 300 MG/ML  SOLN COMPARISON:  None. FINDINGS: Lower chest: No significant pulmonary nodules or acute consolidative airspace disease. Hepatobiliary: Normal liver size. No liver mass. Normal gallbladder with no radiopaque cholelithiasis. No biliary ductal dilatation. Pancreas: Normal, with no mass or duct dilation. Spleen: Normal size.  No mass. Adrenals/Urinary Tract: Normal adrenals. Normal kidneys with no hydronephrosis and no renal mass. Normal bladder. Stomach/Bowel: Normal non-distended stomach. There is mild-to-moderate dilatation of fluid-filled mid to distal small bowel loops up to 4.5 cm diameter. There is stool sign in the distal ileum. There is wall thickening and mucosal hyperenhancement in the terminal ileum. There is free fluid surrounding the dilated small bowel loops in the right lower quadrant. Appendix is not discretely visualized. No large bowel wall thickening or significant diverticulosis. Vascular/Lymphatic: Normal caliber abdominal aorta. Patent portal, splenic, hepatic and renal veins. No pathologically enlarged lymph nodes in the abdomen or pelvis. Reproductive: Grossly normal uterus.  No adnexal mass. Other: No pneumoperitoneum. Small volume free fluid in the pelvis. No focal fluid collection. Musculoskeletal: No aggressive appearing focal osseous lesions. IMPRESSION: 1. Wall thickening and mucosal hyperenhancement in the terminal ileum suggesting nonspecific infectious or inflammatory terminal ileitis, with the differential including inflammatory bowel disease. Please note that the appendix is not discretely visualized and an inflammatory process involving the appendix is difficult to exclude. 2. Mild-to-moderate dilatation of the fluid-filled mid to distal small bowel with stool sign in the distal small bowel indicative of stasis. Findings suggest functional distal small bowel obstruction. 3. Prominent edema surrounding the dilated small bowel loops in the right lower quadrant, presumably inflammatory. No focal fluid collections. No free air. Electronically Signed   By: Delbert PhenixJason A Poff M.D.   On: 03/28/2019 08:26     Labs:   Basic Metabolic Panel: Recent Labs  Lab  03/28/19 0425 03/29/19 0356  NA 137 138  K 3.4* 3.9  CL 104 108  CO2 23 24  GLUCOSE 99 97  BUN 18 7  CREATININE 0.80 0.64  CALCIUM 9.3 8.5*   MG  --  2.1   GFR CrCl cannot be calculated (Unknown ideal weight.). Liver Function Tests: Recent Labs  Lab 03/28/19 0425 03/29/19 0356  AST 18 13*  ALT 10 7  ALKPHOS 53 39  BILITOT 1.0 1.0  PROT 9.2* 6.2*  ALBUMIN 5.0 3.4*   Recent Labs  Lab 03/28/19 0425  LIPASE 29   No results for input(s): AMMONIA in the last 168 hours. Coagulation profile No results for input(s): INR, PROTIME in the last 168 hours.  CBC: Recent Labs  Lab 03/28/19 0425 03/29/19 0356  WBC 8.7 3.5*  HGB 12.4 9.3*  HCT 40.8 31.4*  MCV 81.4 82.2  PLT 228 171   Cardiac Enzymes: No results for input(s): CKTOTAL, CKMB, CKMBINDEX, TROPONINI in the last 168 hours. BNP: Invalid input(s): POCBNP CBG: No results for input(s): GLUCAP in the last 168 hours. D-Dimer No results for input(s): DDIMER in the last 72 hours. Hgb A1c No results for input(s): HGBA1C in the last 72 hours. Lipid Profile No results for input(s): CHOL, HDL, LDLCALC, TRIG, CHOLHDL, LDLDIRECT in the last 72 hours. Thyroid function studies No results for input(s): TSH, T4TOTAL, T3FREE, THYROIDAB in the last 72 hours.  Invalid input(s): FREET3 Anemia work up No results for input(s): VITAMINB12, FOLATE, FERRITIN, TIBC, IRON, RETICCTPCT in the last 72 hours. Microbiology Recent Results (from the past 240 hour(s))  SARS CORONAVIRUS 2 Nasal Swab Aptima Multi Swab     Status: None   Collection Time: 03/28/19  9:07 AM   Specimen: Aptima Multi Swab; Nasal Swab  Result Value Ref Range Status   SARS Coronavirus 2 NEGATIVE NEGATIVE Final    Comment: (NOTE) SARS-CoV-2 target nucleic acids are NOT DETECTED. The SARS-CoV-2 RNA is generally detectable in upper and lower respiratory specimens during the acute phase of infection. Negative results do not preclude SARS-CoV-2 infection, do not rule out co-infections with other pathogens, and should not be used as the sole basis for treatment or other patient management decisions. Negative  results must be combined with clinical observations, patient history, and epidemiological information. The expected result is Negative. Fact Sheet for Patients: SugarRoll.be Fact Sheet for Healthcare Providers: https://www.woods-mathews.com/ This test is not yet approved or cleared by the [place] FDA and  has been authorized for detection and/or diagnosis of SARS-CoV-2 by FDA under an Emergency Use Authorization (EUA). This EUA will remain  in effect (meaning this test can be used) for the duration of the COVID-19 declaration under Section 56 4(b)(1) of the Act, 21 U.S.C. section 360bbb-3(b)(1), unless the authorization is terminated or revoked sooner. Performed at [facility], [place] [address], [place], [place] [address]     Signed: Terrilee Croak  [facility] 03/29/2019, 1:32 PM

## 2019-03-29 NOTE — Progress Notes (Signed)
Nurse reviewed discharge instructions with pt.  Pt verbalized understanding of discharge instructions and follow up appointments.  Work note given to pt prior to discharge.

## 2019-04-13 ENCOUNTER — Other Ambulatory Visit: Payer: Self-pay

## 2019-04-13 DIAGNOSIS — Z20822 Contact with and (suspected) exposure to covid-19: Secondary | ICD-10-CM

## 2019-04-14 LAB — NOVEL CORONAVIRUS, NAA: SARS-CoV-2, NAA: NOT DETECTED

## 2020-02-01 ENCOUNTER — Other Ambulatory Visit: Payer: BLUE CROSS/BLUE SHIELD

## 2020-02-01 ENCOUNTER — Ambulatory Visit: Payer: No Typology Code available for payment source | Attending: Internal Medicine

## 2020-02-01 DIAGNOSIS — Z20822 Contact with and (suspected) exposure to covid-19: Secondary | ICD-10-CM | POA: Insufficient documentation

## 2020-02-02 LAB — NOVEL CORONAVIRUS, NAA: SARS-CoV-2, NAA: NOT DETECTED

## 2020-02-02 LAB — SARS-COV-2, NAA 2 DAY TAT

## 2020-08-01 ENCOUNTER — Ambulatory Visit: Payer: No Typology Code available for payment source

## 2020-08-22 ENCOUNTER — Ambulatory Visit: Payer: No Typology Code available for payment source

## 2020-09-19 ENCOUNTER — Ambulatory Visit: Payer: No Typology Code available for payment source

## 2020-09-26 ENCOUNTER — Ambulatory Visit: Payer: Self-pay

## 2020-10-03 ENCOUNTER — Ambulatory Visit: Payer: Self-pay

## 2021-01-11 IMAGING — CT CT ABDOMEN AND PELVIS WITH CONTRAST
2 of 4 series · 15 of 46 positions shown, 17 images · IV contrast (ISOVUE)
Comparison: None.

CLINICAL DATA: Periumbilical abdominal pain, nausea and vomiting
for 12 hours. History small bowel resection as child.

EXAM:
CT ABDOMEN AND PELVIS WITH CONTRAST
TECHNIQUE: Multidetector CT imaging of the abdomen and pelvis was performed
using the standard protocol following bolus administration of
intravenous contrast.
CONTRAST:  80mL OMNIPAQUE IOHEXOL 300 MG/ML  SOLN

[Series 2: axial st · axial · 0.71mm/px · z∈[-492,-67]mm · 12 of 97 slices shown, 14 images]
[im 6/97  soft-tissue]
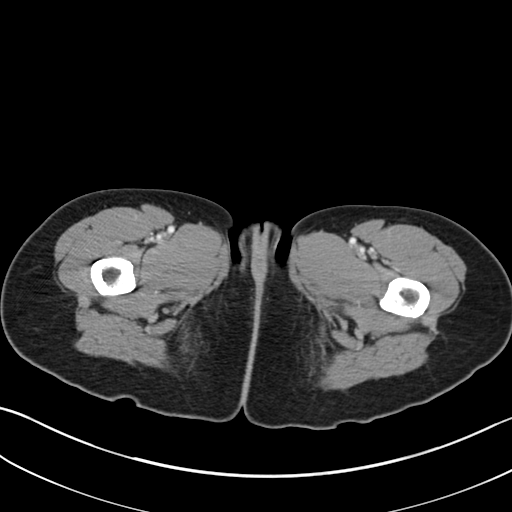
[im 6/97  bone]
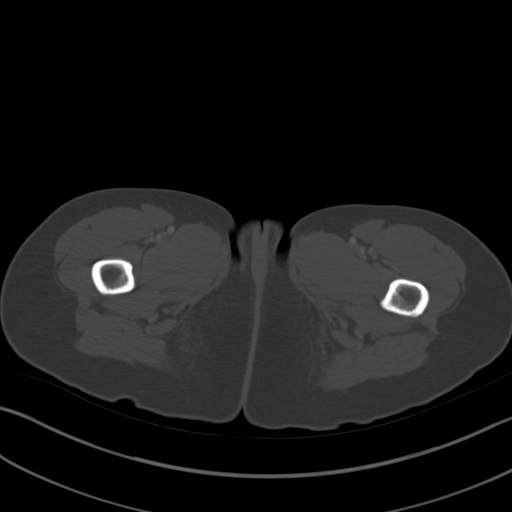
[im 17/97  soft-tissue]
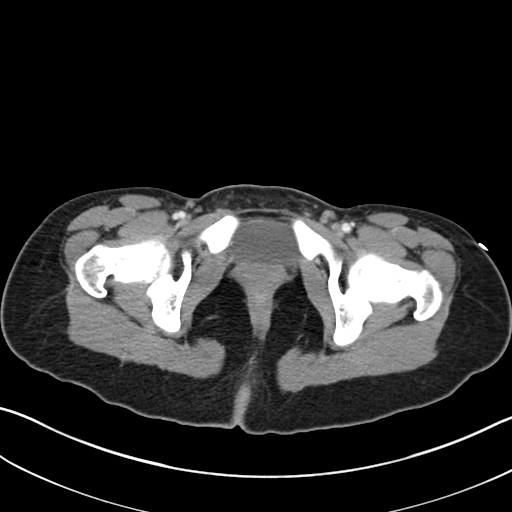
[im 23/97  soft-tissue]
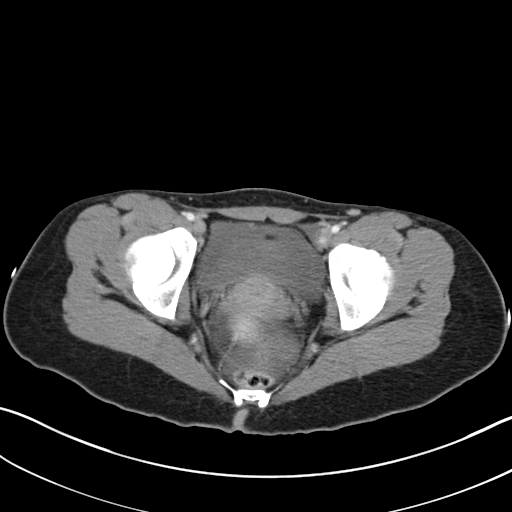
[im 29/97  soft-tissue]
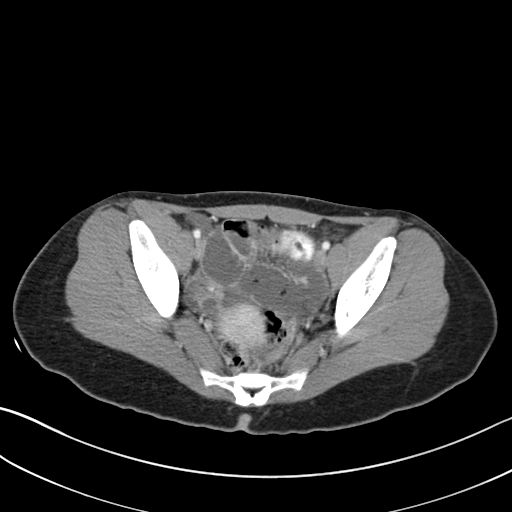
[im 40/97  soft-tissue]
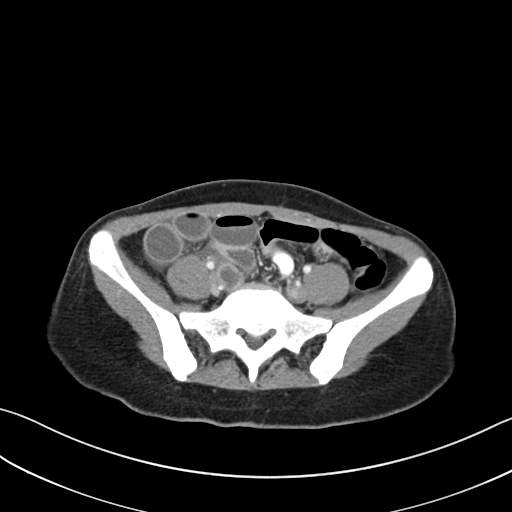
[im 46/97  soft-tissue]
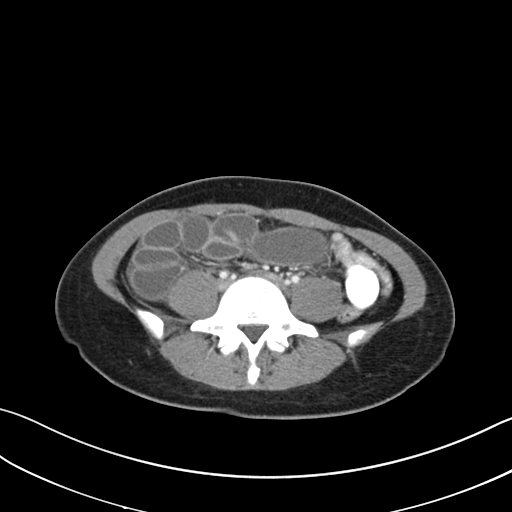
[im 51/97  soft-tissue]
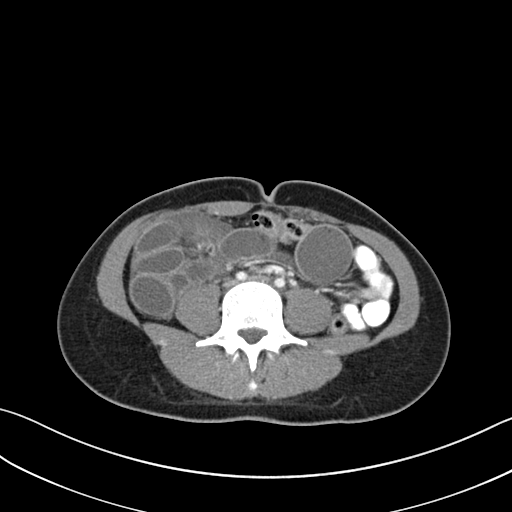
[im 63/97  soft-tissue]
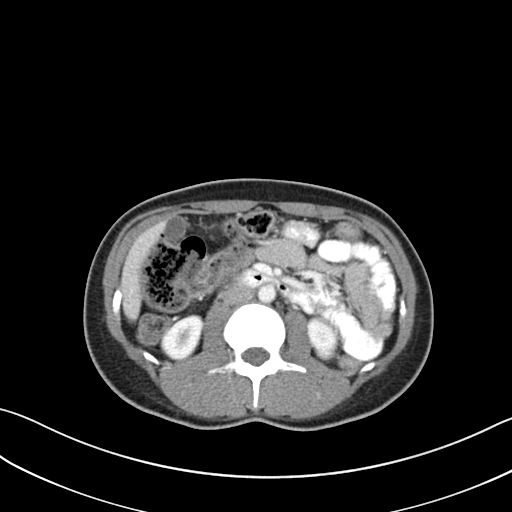
[im 68/97  soft-tissue]
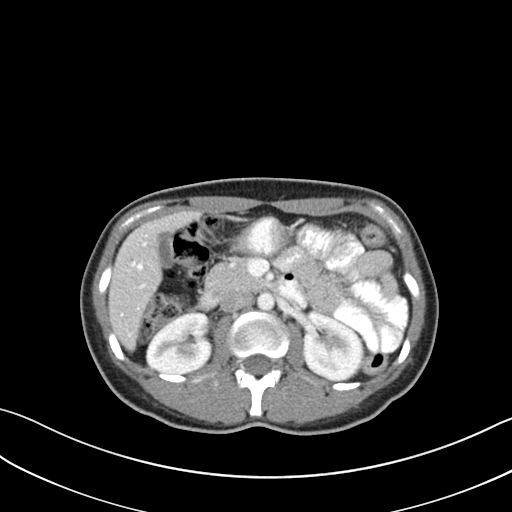
[im 68/97  bone]
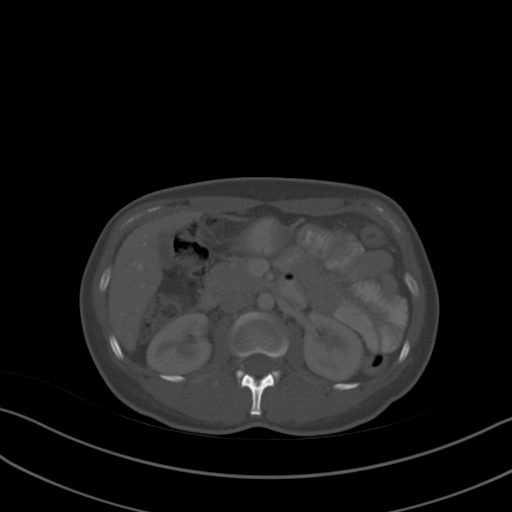
[im 74/97  soft-tissue]
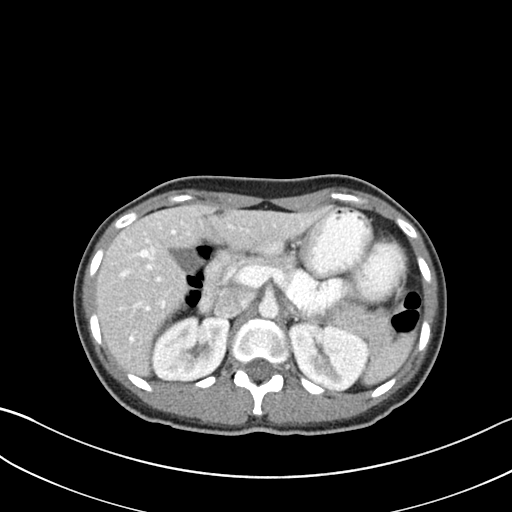
[im 85/97  soft-tissue]
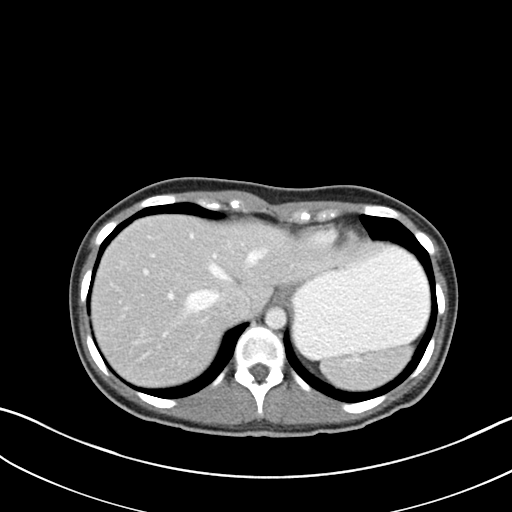
[im 91/97  soft-tissue]
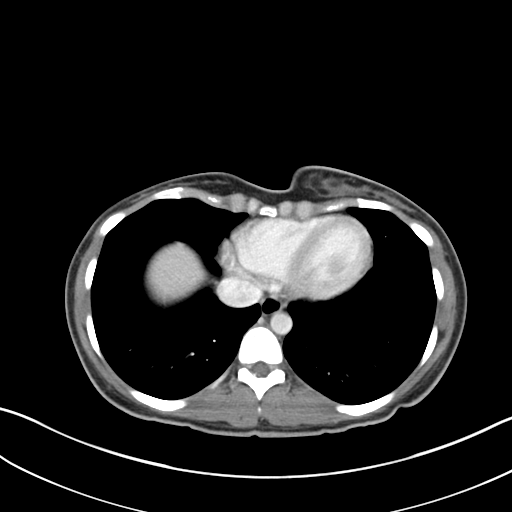

[Series 4: coronal st · coronal · 0.66mm/px · 3 of 93 slices shown]
[im 31/93  soft-tissue]
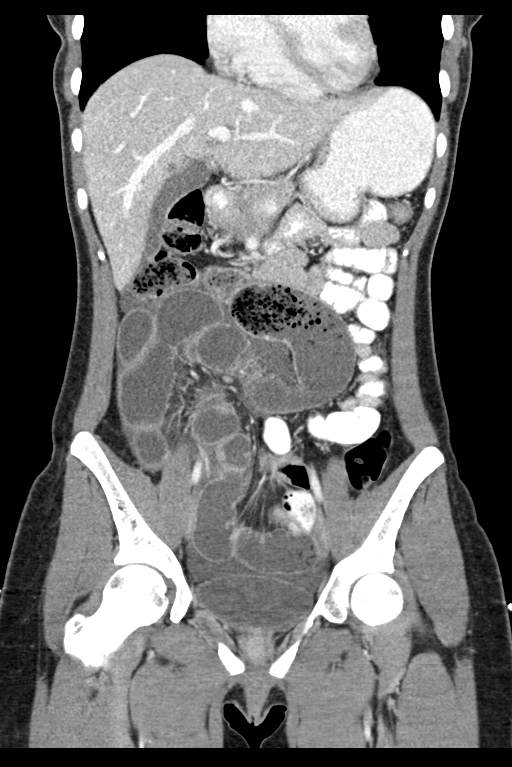
[im 41/93  soft-tissue]
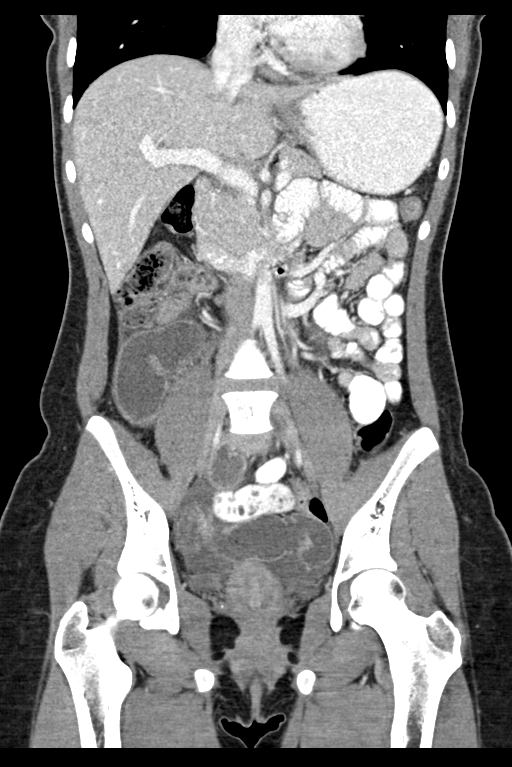
[im 52/93  soft-tissue]
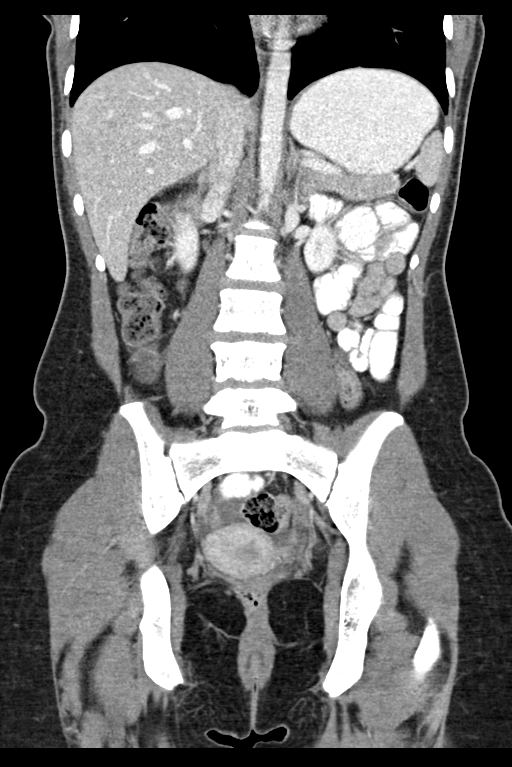

[15 of 46 positions shown; findings below may reference images not displayed]

FINDINGS: Lower chest: No significant pulmonary nodules or acute consolidative
airspace disease.

Hepatobiliary: Normal liver size. No liver mass. Normal gallbladder
with no radiopaque cholelithiasis. No biliary ductal dilatation.

Pancreas: Normal, with no mass or duct dilation.

Spleen: Normal size. No mass.

Adrenals/Urinary Tract: Normal adrenals. Normal kidneys with no
hydronephrosis and no renal mass. Normal bladder.

Stomach/Bowel: Normal non-distended stomach. There is
mild-to-moderate dilatation of fluid-filled mid to distal small
bowel loops up to 4.5 cm diameter. There is stool sign in the distal
ileum. There is wall thickening and mucosal hyperenhancement in the
terminal ileum. There is free fluid surrounding the dilated small
bowel loops in the right lower quadrant. Appendix is not discretely
visualized. No large bowel wall thickening or significant
diverticulosis.

Vascular/Lymphatic: Normal caliber abdominal aorta. Patent portal,
splenic, hepatic and renal veins. No pathologically enlarged lymph
nodes in the abdomen or pelvis.

Reproductive: Grossly normal uterus.  No adnexal mass.

Other: No pneumoperitoneum. Small volume free fluid in the pelvis.
No focal fluid collection.

Musculoskeletal: No aggressive appearing focal osseous lesions.
IMPRESSION: 1. Wall thickening and mucosal hyperenhancement in the terminal
ileum suggesting nonspecific infectious or inflammatory terminal
ileitis, with the differential including inflammatory bowel disease.
Please note that the appendix is not discretely visualized and an
inflammatory process involving the appendix is difficult to exclude.
2. Mild-to-moderate dilatation of the fluid-filled mid to distal
small bowel with stool sign in the distal small bowel indicative of
stasis. Findings suggest functional distal small bowel obstruction.
3. Prominent edema surrounding the dilated small bowel loops in the
right lower quadrant, presumably inflammatory. No focal fluid
collections. No free air.

## 2021-04-17 ENCOUNTER — Telehealth: Payer: Self-pay | Admitting: Family Medicine

## 2021-04-17 NOTE — Telephone Encounter (Signed)
Appointment was scheduled by patient through Mychart and it was scheduled in the wrong slot and she is also have a scheduled appointment at the [facility] office.

## 2021-05-17 ENCOUNTER — Encounter: Payer: BC Managed Care – PPO | Admitting: Family Medicine

## 2021-05-30 ENCOUNTER — Encounter: Payer: Self-pay | Admitting: Family Medicine

## 2021-05-30 ENCOUNTER — Encounter: Payer: BC Managed Care – PPO | Admitting: Family Medicine

## 2021-05-30 NOTE — Progress Notes (Signed)
Patient did not keep appointment today. She may call to reschedule.  

## 2022-09-20 ENCOUNTER — Ambulatory Visit (HOSPITAL_BASED_OUTPATIENT_CLINIC_OR_DEPARTMENT_OTHER)
Admission: RE | Admit: 2022-09-20 | Discharge: 2022-09-20 | Disposition: A | Discharge status: Home / Self Care | Payer: BC Managed Care – PPO | Source: Ambulatory Visit | Attending: Physician Assistant | Admitting: Physician Assistant

## 2022-09-20 ENCOUNTER — Encounter (HOSPITAL_BASED_OUTPATIENT_CLINIC_OR_DEPARTMENT_OTHER): Payer: Self-pay

## 2022-09-20 ENCOUNTER — Other Ambulatory Visit: Payer: Self-pay | Admitting: Physician Assistant

## 2022-09-20 DIAGNOSIS — R1031 Right lower quadrant pain: Secondary | ICD-10-CM | POA: Diagnosis present

## 2022-09-20 MED ORDER — IOHEXOL 300 MG/ML  SOLN
100.0000 mL | Freq: Once | INTRAMUSCULAR | Status: AC | PRN
  Administered 2022-09-20: 75 mL via INTRAVENOUS
